# Patient Record
Sex: Female | Born: 1980 | Race: Black or African American | Hispanic: No | Marital: Single | Smoking: Never smoker
Health system: Southern US, Community
[De-identification: ages and names within clinical notes are randomized; demographics above are authoritative.]

## PROBLEM LIST (undated history)

## (undated) DIAGNOSIS — J209 Acute bronchitis, unspecified: Secondary | ICD-10-CM

## (undated) DIAGNOSIS — I10 Essential (primary) hypertension: Secondary | ICD-10-CM

## (undated) DIAGNOSIS — B019 Varicella without complication: Secondary | ICD-10-CM

## (undated) DIAGNOSIS — B029 Zoster without complications: Secondary | ICD-10-CM

## (undated) DIAGNOSIS — D649 Anemia, unspecified: Secondary | ICD-10-CM

## (undated) DIAGNOSIS — E0590 Thyrotoxicosis, unspecified without thyrotoxic crisis or storm: Secondary | ICD-10-CM

## (undated) HISTORY — DX: Zoster without complications: B02.9

## (undated) HISTORY — DX: Anemia, unspecified: D64.9

## (undated) HISTORY — DX: Thyrotoxicosis, unspecified without thyrotoxic crisis or storm: E05.90

## (undated) HISTORY — DX: Varicella without complication: B01.9

## (undated) HISTORY — DX: Essential (primary) hypertension: I10

## (undated) HISTORY — DX: Acute bronchitis, unspecified: J20.9

---

## 2007-09-02 HISTORY — PX: THYROID SURGERY: SHX805

## 2007-09-07 ENCOUNTER — Other Ambulatory Visit: Admission: RE | Admit: 2007-09-07 | Discharge: 2007-09-07 | Payer: Self-pay | Admitting: Obstetrics and Gynecology

## 2007-11-03 ENCOUNTER — Encounter: Admission: RE | Admit: 2007-11-03 | Discharge: 2007-11-03 | Payer: Self-pay | Admitting: Endocrinology

## 2007-12-03 ENCOUNTER — Encounter: Admission: RE | Admit: 2007-12-03 | Discharge: 2007-12-03 | Payer: Self-pay | Admitting: Endocrinology

## 2008-04-24 ENCOUNTER — Ambulatory Visit: Payer: Self-pay | Admitting: *Deleted

## 2008-04-24 DIAGNOSIS — I10 Essential (primary) hypertension: Secondary | ICD-10-CM | POA: Insufficient documentation

## 2008-04-24 DIAGNOSIS — R233 Spontaneous ecchymoses: Secondary | ICD-10-CM | POA: Insufficient documentation

## 2008-04-24 DIAGNOSIS — B029 Zoster without complications: Secondary | ICD-10-CM | POA: Insufficient documentation

## 2008-04-26 DIAGNOSIS — E038 Other specified hypothyroidism: Secondary | ICD-10-CM | POA: Insufficient documentation

## 2008-05-03 ENCOUNTER — Telehealth (INDEPENDENT_AMBULATORY_CARE_PROVIDER_SITE_OTHER): Payer: Self-pay | Admitting: *Deleted

## 2008-05-10 ENCOUNTER — Telehealth (INDEPENDENT_AMBULATORY_CARE_PROVIDER_SITE_OTHER): Payer: Self-pay | Admitting: *Deleted

## 2008-11-15 ENCOUNTER — Ambulatory Visit: Payer: Self-pay | Admitting: *Deleted

## 2008-11-15 DIAGNOSIS — R079 Chest pain, unspecified: Secondary | ICD-10-CM | POA: Insufficient documentation

## 2009-02-08 ENCOUNTER — Other Ambulatory Visit: Admission: RE | Admit: 2009-02-08 | Discharge: 2009-02-08 | Payer: Self-pay | Admitting: Obstetrics and Gynecology

## 2009-04-21 ENCOUNTER — Emergency Department (HOSPITAL_BASED_OUTPATIENT_CLINIC_OR_DEPARTMENT_OTHER): Admission: EM | Admit: 2009-04-21 | Discharge: 2009-04-21 | Payer: Self-pay | Admitting: Emergency Medicine

## 2009-04-21 ENCOUNTER — Ambulatory Visit: Payer: Self-pay | Admitting: Diagnostic Radiology

## 2009-05-01 ENCOUNTER — Ambulatory Visit: Payer: Self-pay | Admitting: Family Medicine

## 2009-05-02 LAB — CONVERTED CEMR LAB
ALT: 15 units/L (ref 0–35)
AST: 18 units/L (ref 0–37)
Albumin: 4.6 g/dL (ref 3.5–5.2)
Alkaline Phosphatase: 32 units/L — ABNORMAL LOW (ref 39–117)
Basophils Absolute: 0 10*3/uL (ref 0.0–0.1)
Calcium: 9.2 mg/dL (ref 8.4–10.5)
Chloride: 104 meq/L (ref 96–112)
Hemoglobin: 12.6 g/dL (ref 12.0–15.0)
Lymphocytes Relative: 37 % (ref 12–46)
MCHC: 34.6 g/dL (ref 30.0–36.0)
MCV: 89 fL (ref 78.0–100.0)
Monocytes Absolute: 0.4 10*3/uL (ref 0.1–1.0)
Neutro Abs: 2.7 10*3/uL (ref 1.7–7.7)
Neutrophils Relative %: 53 % (ref 43–77)
Sodium: 138 meq/L (ref 135–145)
Total Bilirubin: 0.7 mg/dL (ref 0.3–1.2)
Total Protein: 7.4 g/dL (ref 6.0–8.3)

## 2009-05-03 ENCOUNTER — Telehealth: Payer: Self-pay | Admitting: Family Medicine

## 2009-05-10 ENCOUNTER — Emergency Department (HOSPITAL_BASED_OUTPATIENT_CLINIC_OR_DEPARTMENT_OTHER): Admission: EM | Admit: 2009-05-10 | Discharge: 2009-05-10 | Payer: Self-pay | Admitting: Emergency Medicine

## 2009-06-05 ENCOUNTER — Encounter: Payer: Self-pay | Admitting: Family Medicine

## 2009-07-18 ENCOUNTER — Emergency Department (HOSPITAL_BASED_OUTPATIENT_CLINIC_OR_DEPARTMENT_OTHER): Admission: EM | Admit: 2009-07-18 | Discharge: 2009-07-19 | Payer: Self-pay | Admitting: Emergency Medicine

## 2009-07-19 ENCOUNTER — Ambulatory Visit: Payer: Self-pay | Admitting: Diagnostic Radiology

## 2009-10-29 ENCOUNTER — Ambulatory Visit (HOSPITAL_BASED_OUTPATIENT_CLINIC_OR_DEPARTMENT_OTHER): Admission: RE | Admit: 2009-10-29 | Discharge: 2009-10-29 | Payer: Self-pay | Admitting: Internal Medicine

## 2009-10-29 ENCOUNTER — Ambulatory Visit: Payer: Self-pay | Admitting: Family

## 2009-10-29 ENCOUNTER — Ambulatory Visit: Payer: Self-pay | Admitting: Radiology

## 2009-10-29 LAB — CONVERTED CEMR LAB: Beta hcg, urine, semiquantitative: NEGATIVE

## 2010-02-06 ENCOUNTER — Ambulatory Visit: Payer: Self-pay | Admitting: Family

## 2010-02-06 LAB — CONVERTED CEMR LAB
BUN: 10 mg/dL (ref 6–23)
Calcium: 8.9 mg/dL (ref 8.4–10.5)
Chloride: 103 meq/L (ref 96–112)
Cholesterol: 176 mg/dL (ref 0–200)
Creatinine, Ser: 0.97 mg/dL (ref 0.40–1.20)
HDL: 57 mg/dL (ref >39)
LDL Cholesterol: 103 mg/dL — ABNORMAL HIGH (ref 0–99)
MCV: 91 fL (ref 78.0–100.0)
Total CHOL/HDL Ratio: 3.1
Triglycerides: 81 mg/dL (ref <150)
VLDL: 16 mg/dL (ref 0–40)

## 2010-02-07 ENCOUNTER — Encounter: Payer: Self-pay | Admitting: Family

## 2010-03-26 ENCOUNTER — Encounter: Payer: Self-pay | Admitting: Internal Medicine

## 2010-03-26 ENCOUNTER — Telehealth: Payer: Self-pay | Admitting: Internal Medicine

## 2010-03-26 ENCOUNTER — Encounter: Payer: Self-pay | Admitting: Family

## 2010-04-02 ENCOUNTER — Ambulatory Visit: Payer: Self-pay | Admitting: Internal Medicine

## 2010-05-03 ENCOUNTER — Ambulatory Visit: Payer: Self-pay | Admitting: Internal Medicine

## 2010-08-13 ENCOUNTER — Ambulatory Visit: Payer: Self-pay | Admitting: Family

## 2010-08-13 ENCOUNTER — Encounter: Payer: Self-pay | Admitting: Internal Medicine

## 2010-08-13 ENCOUNTER — Telehealth: Payer: Self-pay | Admitting: Internal Medicine

## 2010-08-13 ENCOUNTER — Encounter: Payer: Self-pay | Admitting: Family

## 2010-08-13 DIAGNOSIS — R29818 Other symptoms and signs involving the nervous system: Secondary | ICD-10-CM | POA: Insufficient documentation

## 2010-08-16 ENCOUNTER — Encounter: Payer: Self-pay | Admitting: Family

## 2010-10-01 NOTE — Letter (Signed)
Summary: [facility] Endocrinology  [facility]   Imported By: Lanelle Bal 04/12/2010 13:52:39  _____________________________________________________________________  External Attachment:    Type:   Image     Comment:   External Document

## 2010-10-01 NOTE — Assessment & Plan Note (Signed)
Summary: Lower abd  pain/hea   Vital Signs:  Patient profile:   30 year old female Menstrual status:  regular Weight:      132.50 pounds BMI:     22.83 Temp:     98.1 degrees F oral Pulse rate:   76 / minute Pulse rhythm:   regular Resp:     16 per minute BP sitting:   122 / 98  (right arm) Cuff size:   regular  Vitals Entered By: Mervin Kung CMA (October 29, 2009 1:33 PM) CC: room 4 abd pain Comments RLQ pain, "dull ache that comes and goes" since last Tuesday; especially after running. Swollen glands in neck since thursday. Pt states she did not start the BP med prescribed last.   Primary Care Provider:  Paulo Fruit MD  CC:  room 4 abd pain.  History of Present Illness: Claire Haynes is a 30 year old female who presents with  c/o RLQ pain since last Tuesday.  Notes that on Thursday she tried to run on treadmill but had to stop due pain.  Now pain is intermittent and nagging.  She has not taken any OTC meds.  She did have some nausea on friday night, but this had resolved by the time that she woke up on saturday.   Denies fever, but notes + lethargy.  Notes + swollen glands and on the back of her neck.  Notes intermittent diarrhea.  LMP was last week and was normal.  Patient still has appendix.    Allergies (verified): No Known Drug Allergies  Physical Exam  General:  Well-developed,well-nourished,in no acute distress; alert,appropriate and cooperative throughout examination Head:  Normocephalic and atraumatic without obvious abnormalities. No apparent alopecia or balding. Eyes:  + exopthalmos Neck:  mild cervical LAD, mild occipital LAD Lungs:  Normal respiratory effort, chest expands symmetrically. Lungs are clear to auscultation, no crackles or wheezes. Heart:  Normal rate and regular rhythm. S1 and S2 normal without gallop, murmur, click, rub or other extra sounds. Abdomen:  soft, + BS,  + RLQ pain to palpation without guarding.  No sign of acute  abdomen.   Impression & Recommendations:  Problem # 1:  ABDOMINAL PAIN, RIGHT LOWER QUADRANT (ICD-789.03) Assessment New Reviewed CT- negative.  I suspect that this pain is due to muscle strain.  I call patient and discussed these results.  Recommended that she try motrin and limit running until it feels better.  F/u if no improvement in the next few weeks.   Orders: Misc. Referral (Misc. Ref) Urine Pregnancy Test  (56213)  Problem # 2:  CERVICAL LYMPHADENOPATHY (ICD-785.6) Assessment: New Mild cervical/occipital LAD- ? due to viral eitiology- monitor.    Complete Medication List: 1)  Synthroid 50 Mcg Tabs (Levothyroxine sodium) .... Take 1 tablet by mouth once a day 2)  Ramipril 2.5 Mg Caps (Ramipril) .Marland Kitchen.. 1 tab by mouth daily  Patient Instructions: 1)  Please complete your CT today. 2)  Call if fever over 101, worsening abdominal pain, nausea, vomitting or diarrhea.  Current Allergies (reviewed today): No known allergies    Laboratory Results   Urine Tests      Urine HCG: negative

## 2010-10-01 NOTE — Letter (Signed)
    at [facility] [address] [place], [place]  [address]  [place] Phone: [phone]      February 07, 2010   [facility] [address] [place], [place] [address]  RE:  LAB RESULTS  Dear  Ms. Peacock,  The following is an interpretation of your most recent lab tests.  Please take note of any instructions provided or changes to medications that have resulted from your lab work.  ELECTROLYTES:  Good - no changes needed  KIDNEY FUNCTION TESTS:  Good - no changes needed  LIVER FUNCTION TESTS:  Good - no changes needed  LIPID PANEL:  Good - no changes needed Triglyceride: 81   Cholesterol: 176   LDL: 103   HDL: 57   Chol/HDL%:  3.1 Ratio   CBC:  Good - no changes needed   Sincerely Yours,    Lemont Fillers FNP

## 2010-10-01 NOTE — Assessment & Plan Note (Signed)
Summary: cpx w/pap/dt--Rm 4   Vital Signs:  Patient profile:   30 year old female Menstrual status:  regular LMP:     01/15/2010 Height:      64 inches Weight:      130.75 pounds BMI:     22.52 Temp:     98.7 degrees F oral Pulse rate:   78 / minute Pulse rhythm:   regular Resp:     16 per minute BP sitting:   118 / 80  (right arm) Cuff size:   regular  Vitals Entered By: Mervin Kung CMA (February 06, 2010 11:30 AM) CC: Room 4  Pt here for physical and pap smear. Pt would like copy of office visit today. LMP (date): 01/15/2010 LMP - Character: normal LMP - Reliable? Yes Menarche (age onset years): 13   Menses interval (days): 28 Menstrual flow (days): 5 Enter LMP: 01/15/2010 Last PAP Result Normal   Primary Care Provider:  Paulo Fruit MD  CC:  Room 4  Pt here for physical and pap smear. Pt would like copy of office visit today.Marland Kitchen  History of Present Illness: Ms Eckersley is a 30 year old female who presents today for a complete physical.  Preventative- Runs 3 days a week about 1.5 miles each time.  Also weight lifts 3 days a week.  Diet is not always healthy- eats too many sweets.  She continues to see Dr. Peterson Ao from endocrinology for her hypothyroid.  Notes 5-6 lifetime sexual partners.   Preventive Screening-Counseling & Management  Hep-HIV-STD-Contraception     STD Risk: no risk noted  Allergies (verified): No Known Drug Allergies  Past History:  Past Medical History: Last updated: 11/15/2008 Hyperthyroidism - s/p ablation - now with hypothyroidism - sees endocrinology anemia  Past Surgical History: Last updated: 11/15/2008 thyroid ablation  Family History: Last updated: 02/06/2010 no significant medical history noted  Mom- living, HTN, Hyperlipidemia, Hyperthyroidsm Dad- living, pt unsure of history Sister- alive and well Brother- healthy  Social History: Last updated: 02/06/2010 Occupation:patient is a Engineer, civil (consulting) - RN at [employer]  Oncology single no children Never smoked Denies ETOH Denies drug use.  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 Never smoked Denies ETOH Denies drug use.STD Risk:  no risk noted  Review of Systems       Constitutional: Denies Fever ENT:  mild nasal congestion(cold) Resp: Denies cough CV:  Denies Chest Pain, sob GI:  Denies nausea or vomitting GU: Denies dysuria Lymphatic: Denies lymphadenopathy Musculoskeletal:  Denies muscle/joint pain Skin:  Denies Rashes or concerning lesions Psychiatric: Denies depression or anxiety Neuro: Denies numbness or weakness     Physical Exam  General:  Well-developed,well-nourished,in no acute distress; alert,appropriate and cooperative throughout examination Head:  Normocephalic and atraumatic without obvious abnormalities. No apparent alopecia or balding. Eyes:  PERRLA Ears:  External ear exam shows no significant lesions or deformities.  Otoscopic examination reveals clear canals, tympanic membranes are intact bilaterally without bulging, retraction, inflammation or discharge. Hearing is grossly normal bilaterally. Mouth:  Oral mucosa and oropharynx without lesions or exudates.  Teeth in good repair. Neck:  No deformities, masses, or noted. Lungs:  Normal respiratory effort,  chest expands symmetrically. Lungs are clear to auscultation, no crackles or wheezes. Heart:  Normal rate and regular rhythm. S1 and S2 normal without gallop, murmur, click, rub or other extra sounds. Abdomen:  Bowel sounds positive,abdomen soft and non-tender  without masses, organomegaly or hernias noted. Genitalia:  Pelvic Exam:        External: normal female genitalia without lesions or masses        Vagina: normal without lesions or masses        Cervix: normal without lesions or masses        Adnexa: normal bimanual exam without masses or fullness        Uterus: normal by palpation        Pap smear: not performed Extremities:  No clubbing, cyanosis, edema, or deformity noted  Neurologic:  No cranial nerve deficits noted.  Plantar reflexes are down-going bilaterally. DTRs are symmetrical throughout. Sensory, motor and coordinative functions appear intact. Skin:  Intact without suspicious lesions or rashes Psych:  Cognition and judgment appear intact. Alert and cooperative with normal attention span and concentration. No apparent delusions, illusions, hallucinations   Impression & Recommendations:  Problem # 1:  Preventive Health Care (ICD-V70.0) Assessment Comment Only  Immunizations reviewed and up to date.  Patient exercises regularly and was encouraged to continue.  She was counseled on healthy diet.  Pap smear attempted today, but not completed due to technically difficult exam (cervix very posterior)  Recommend that she follow up with GYN for Pap- pt will call for appointment.  Orders: T-Basic Metabolic Panel [phone]) T-Lipid Profile [id number]) T-CBC No Diff ([id number]) T-HIV-1 (Screen) (10272)  Complete Medication List: 1)  Synthroid 50 Mcg Tabs (Levothyroxine sodium) .... Take 1 tablet by mouth once a day  Patient Instructions: 1)  Good job with the exercise. 2)  Try to work hard on a healthy diet. 3)  If you do not eat 3 servings of dairy every day, you should add a calcium supplement Caltrate 600mg  + D twice daily. 4)  Follow up in 1 year.   Preventive Care Screening  Last Tetanus Booster:    Date:  01/30/2010    Results:  Historical     Immunization History:  Influenza Immunization History:     Influenza:  historical (07/20/2009)   Current Allergies (reviewed today): No known allergies

## 2010-10-01 NOTE — Assessment & Plan Note (Signed)
Summary: pt wants to see Dr Artist Pais,  Bp  & HA  Claire Haynes   Vital Signs:  Patient profile:   30 year old female Menstrual status:  regular Height:      64 inches Weight:      134.50 pounds BMI:     23.17 O2 Sat:      100 % on Room air Temp:     98.5 degrees F oral Pulse rate:   72 / minute Pulse rhythm:   regular Resp:     14 per minute BP sitting:   140 / 100  (right arm) Cuff size:   regular  Vitals Entered By: Glendell Docker CMA (April 02, 2010 8:27 AM)  O2 Flow:  Room air CC: Rm 3- Follow up on blood pressure and headaches Is Patient Diabetic? No Pain Assessment Patient in pain? no      Comments c/o headaches 2-3 times a week lasting all day, light but no sound sensitivity, checking blood pressure at work 160/94, 155/92, past year they have started to elevated no added supplements   Primary Care Provider:  Lemont Fillers FNP  CC:  Rm 3- Follow up on blood pressure and headaches.  History of Present Illness: 30 y/o AA female c/o intermittent headaches worse over last 3 - 4 months.  assoc with BP elevation Dr. Cathey Endow prescribed - ramipril but never started Prev PCP recommended  HCTZ but 155 , 160/94  she also could not tolerate increase in urinary freq  MVI - C, D, E no other OTC meds or supplements  left sided HA nagging pain not relieved by tylenol 2 cups of AM when she works no coffee on days off  exercises regularly no supplement use  Preventive Screening-Counseling & Management  Alcohol-Tobacco     Smoking Status: never  Allergies (verified): No Known Drug Allergies  Past History:  Past Medical History: Hyperthyroidism - s/p ablation - now with hypothyroidism - sees endocrinology anemia    Past Surgical History: thyroid ablation   Family History: no significant medical history noted  Mom- living, HTN, Hyperlipidemia, Hyperthyroidsm Dad- living, pt unsure of history Sister- alive and well Brother- healthy    Social  History: Occupation:patient is a Engineer, 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  Physical Exam  General:  alert, well-developed, and well-nourished.   Lungs:  Normal respiratory effort, chest expands symmetrically. Lungs are clear to auscultation, no crackles or wheezes. Heart:  Normal rate and regular rhythm. S1 and S2 normal without gallop, murmur, click, rub or other extra sounds. Abdomen:  soft, non-tender, and normal bowel sounds.     Impression & Recommendations:  Problem # 1:  HYPERTENSION (ICD-401.9) Elevated BP likely assoc with headaches. we discussed bystolic - category C for pregnancy  Her updated medication list for this problem includes:    Bystolic 5 Mg Tabs (Nebivolol hcl) ..... One by mouth once daily  BP today: 140/100 Prior BP: 118/80 (02/06/2010)  Labs Reviewed: K+: 3.8 (02/06/2010) Creat: : 0.97 (02/06/2010)   Chol: 176 (02/06/2010)   HDL: 57 (02/06/2010)   LDL: 103 (02/06/2010)   TG: 81 (02/06/2010)  Complete Medication List: 1)  Synthroid 50 Mcg Tabs (Levothyroxine sodium) .... Take 1 tablet by mouth once a day 2)  Bystolic 5 Mg Tabs (Nebivolol hcl) .... One by mouth once daily  Patient Instructions: 1)  Please schedule a follow-up appointment in 6 weeks. 2)  [email or web address]  3)  Limit your Sodium (Salt) to less than 2.5 grams a day(approx 1/2 a teaspoon)   Preventive Care Screening  Pap Smear:    Date:  03/25/2010    Results:  normal    Current Allergies (reviewed today): No known allergies

## 2010-10-01 NOTE — Progress Notes (Signed)
Summary: Request rx for Ramipril  Phone Note Call from Patient Call back at Home Phone [phone]   Caller: Patient Call For: osullivan  Summary of Call: she had a script from Dr Cathey Endow for ramipril and needs a refill.  [facility]  Initial call taken by: Roselle Locus,  March 26, 2010 2:55 PM  Follow-up for Phone Call        Left message on machine to return my call. Nicki Guadalajara Fergerson CMA Duncan Dull)  March 26, 2010 4:29 PM   Additional Follow-up for Phone Call Additional follow up Details #1::        Pt returned my call and stated that her home readings have been elevated recently: 145-155 systolic and in the 90s diastolic. At recent endocrinology appt BP was 155/93. Pt now wants to try Ramipril rx that Dr. Cathey Endow recommended some time back.  Please advise.  Nicki Guadalajara Fergerson CMA Duncan Dull)  March 26, 2010 4:48 PM     Additional Follow-up for Phone Call Additional follow up Details #2::    ACE inhibitor would not be my first choice in 30 y/o female of child bearing age her prev BP readings have been normal I suggest she keep log of  daily BP readings and f/u with Melissa within 2 weeks also, make sure pt not taking OTC decongestants or NSAIDs Follow-up by: [name] DO,  March 26, 2010 5:06 PM  Additional Follow-up for Phone Call Additional follow up Details #3:: Details for Additional Follow-up Action Taken: Advised pt per Dr. Olegario Messier instructions. Pt states she is not taking decongestants or NSAIDs.  Pt requested to schedule follow up with Dr. Artist Pais. Appt. scheduled for 04/17/10 @ 9:30am.  Mervin Kung CMA (AAMA)  March 27, 2010 9:32 AM

## 2010-10-01 NOTE — Assessment & Plan Note (Signed)
Summary: New pt, follow up bp readings / tf,cma   Vital Signs:  Patient profile:   30 year old female Menstrual status:  regular Height:      64 inches Weight:      135.50 pounds BMI:     23.34 O2 Sat:      100 % on Room air Temp:     98.5 degrees F oral Pulse rate:   65 / minute Pulse rhythm:   regular Resp:     16 per minute BP sitting:   116 / 80  (left arm) Cuff size:   regular  Vitals Entered By: Glendell Docker CMA (May 03, 2010 9:40 AM)  O2 Flow:  Room air CC: follow-up visit Is Patient Diabetic? No Pain Assessment Patient in pain? no      Comments folloow up on blood pressure, improved, no concerns   Primary Care Sylvanna Burggraf:  Lemont Fillers FNP  CC:  follow-up visit.  History of Present Illness:  Hypertension Follow-Up      This is a 30 year old woman who presents for Hypertension follow-up.  The patient denies lightheadedness and headaches.  The patient denies the following associated symptoms: chest pain.  Compliance with medications (by patient report) has been near 100%.  The patient reports that dietary compliance has been fair.  The patient reports exercising 3-4X per week.    Preventive Screening-Counseling & Management  Alcohol-Tobacco     Smoking Status: never  Allergies (verified): No Known Drug Allergies  Past History:  Past Medical History: Hyperthyroidism - s/p ablation - now with hypothyroidism - sees endocrinology anemia     Past Surgical History: thyroid ablation    Family History: no significant medical history noted  Mom- living, HTN, Hyperlipidemia, Hyperthyroidsm Dad- living, pt unsure of history Sister- alive and well Brother- healthy      Physical Exam  General:  alert, well-developed, and well-nourished.   Neck:  No deformities, masses, or noted. Lungs:  Normal respiratory effort, chest expands symmetrically. Lungs are clear to auscultation, no crackles or wheezes. Heart:  Normal rate and regular rhythm. S1  and S2 normal without gallop, murmur, click, rub or other extra sounds. Extremities:  No clubbing, cyanosis, edema, or deformity noted    Impression & Recommendations:  Problem # 1:  HYPERTENSION (ICD-401.9) Assessment Improved Headaches much better.  occ BP is low normal.  no dizziness.  she can monitor BP at home.  pt will try taking 1/2 bystolic she understands bystolic in preg category C. pt will notify us re:  family planning.  if and when she starts family - switch to labetalol Her updated medication list for this problem includes:    Bystolic 5 Mg Tabs (Nebivolol hcl) ..... 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 One by mouth qd  last TSH at [facility] - 4.497. Increase synthroid to .  goal TSH between 1 and 2  Complete Medication List: 1)  Synthroid 75 Mcg Tabs (Levothyroxine sodium) .... One by mouth qd 2)  Bystolic 5 Mg Tabs (Nebivolol hcl) .... One by mouth once daily  Patient Instructions: 1)  Please schedule a follow-up appointment in 6 months. 2)  TSH prior to visit, ICD-9:  244.9 3)  Please return for lab work within 3 months Prescriptions: BYSTOLIC 5 MG TABS (NEBIVOLOL HCL) one by mouth once daily  #  90 x 1   Entered and Authorized by:   [name] DO   Signed by:   [name] DO on 05/03/2010   Method used:   Electronically to        [facility]* (retail)       [facility] [place], [place]  [address]       Ph: [phone]       Fax: [phone]   RxID:   [id number] SYNTHROID 75 MCG TABS (LEVOTHYROXINE SODIUM) one by mouth qd  #90 x 1   Entered and Authorized by:   [name] DO   Signed by:   [name] DO on 05/03/2010   Method used:   Electronically to        [facility]* (retail)       [facility] [place], [place]  [address]       Ph: [phone]       Fax: [phone]   RxID:   [id number]   Current Allergies (reviewed today): No known allergies

## 2010-10-03 NOTE — Progress Notes (Signed)
Summary: Chest Pain  Phone Note Call from Patient Call back at Home Phone [phone]   Caller: Patient Call For: [name] DO Summary of Call: patient called states that she has been having chest pain, but she is not having shortness of breath. The pain occurs on right side and sometimes on her left, intermittently for the past 2 months ,lasting 3-4 minutes  occurring 3-4 times throughout the day.  She states it is more a pulling sensation than anything else. She states that she has been seen in the past for similar problems and was diagnosed with a muscoskeletal pain. She was informed that she would need evaluation. She was advised, if did not want to schedule for today, that she would need to seek care at an urgent care facility or ER. Patient state she will schedule. Appointment has been provided with Melissa @ 1:30 pm Initial call taken by: Glendell Docker CMA,  August 13, 2010 10:20 AM

## 2010-10-03 NOTE — Assessment & Plan Note (Signed)
Summary: CHEST PAIN/DK--Rm 4   Vital Signs:  Patient profile:   30 year old female Menstrual status:  regular Height:      64 inches Weight:      137 pounds BMI:     23.60 Temp:     98.2 degrees F oral Pulse rate:   60 / minute Pulse rhythm:   regular Resp:     12 per minute BP sitting:   120 / 90  (right arm) Cuff size:   regular  Vitals Entered By: Mervin Kung CMA Duncan Dull) (August 13, 2010 1:31 PM) CC: Pt states she has had intermittent pressure and pulling in her chest 3-4 x daily for 2 months. Pt would like cardiac workup and referral to cardiologist. Thyroid dose was increased a couple of months ago. Is Patient Diabetic? No Pain Assessment Patient in pain? no      Comments Pt agrees all med doses and directions are correct. Nicki Guadalajara Fergerson CMA Duncan Dull)  August 13, 2010 1:38 PM    Primary Care Provider:  Lemont Fillers FNP  CC:  Pt states she has had intermittent pressure and pulling in her chest 3-4 x daily for 2 months. Pt would like cardiac workup and referral to cardiologist. Thyroid dose was increased a couple of months ago.Marland Kitchen  History of Present Illness: Ms. Ackerley is a 30 year old female who presents today with chief complaint of chest pain.  Chest pain has been present for several months.  Intermittent in nature. Pain lasts 3 minutes, then will stop. Increasing in frequency. Sometimes on right side of chest and sometimes on left.  Pain is like a pulling, nagging pain.  Symptoms worsen with deep breath. Patient continues to do weight training 3x a week.  Now doing a more rigorous work out plan.  Denies any shortness of breath, wheezing or palpitations.   Patient also notes some associated right shoulder pain which is worsened by certain exercises.  She has tried ibuprofen with some relief.    Allergies (verified): No Known Drug Allergies  Past History:  Past Medical History: Last updated: 05/03/2010 Hyperthyroidism - s/p ablation - now with  hypothyroidism - sees endocrinology anemia     Past Surgical History: Last updated: 05/03/2010 thyroid ablation    Review of Systems       see HPI  Physical Exam  General:  Well-developed,well-nourished,in no acute distress; alert,appropriate and cooperative throughout examination Eyes:  +exopthalmos, sclera are clear Chest Wall:  no tenderness.   Lungs:  Normal respiratory effort, chest expands symmetrically. Lungs are clear to auscultation, no crackles or wheezes. Heart:  Normal rate and regular rhythm. S1 and S2 normal without gallop, murmur, click, rub or other extra sounds. Msk:  Full ROM of right shoulder, no crepitus.  No swelling.     Impression & Recommendations:  Problem # 1:  MUSCULOSKELETAL PAIN (ICD-781.99) Assessment New 30 year old female with atypical chest pain most likely due to musculoskeletal pain.  EKG performed today shows NSR.  Pt is very active and does regular weight training.  Recommended ibuprofen as needed and provided reassurance.  She does not wish to persue any further work up of her right shoulder pain at this time, but will try to tailor her work out to avoid further shoulder strain.    Problem # 2:  HYPOTHYROIDISM, POSTABLATION (ICD-244.8) Assessment: Comment Only Synthroid was increased from 50 micrograms to 75 micrograms in september, will check f/u TSH.   Her updated medication list for this  problem includes:    Synthroid 75 Mcg Tabs (Levothyroxine sodium) ..... One by mouth qd  Complete Medication List: 1)  Synthroid 75 Mcg Tabs (Levothyroxine sodium) .... One by mouth qd 2)  Bystolic 5 Mg Tabs (Nebivolol hcl) .... One by mouth once daily 3)  Daily Multi Tabs (Multiple vitamins-minerals) .... Take 1 tablet by mouth once a day.  Other Orders: EKG w/ Interpretation (93000) TLB-TSH (Thyroid Stimulating Hormone) (84443-TSH)  Patient Instructions: 1)  Take 400-600mg  of Ibuprofen (Advil, Motrin) with food every 4-6 hours as needed for relief  of pain. 2)  Avoid exercises that worsen your pain.  3)  Call if symptoms worsen, or do not improve. 4)  Please complete your lab work on the 1st floor today. 5)  Follow up with Dr. Artist Pais in 3 months- sooner if problems or concerns.    Orders Added: 1)  EKG w/ Interpretation [93000] 2)  TLB-TSH (Thyroid Stimulating Hormone) [84443-TSH] 3)  Est. Patient Level III [[address]]    Current Allergies (reviewed today): No known allergies

## 2010-10-03 NOTE — Letter (Signed)
   [place] at [facility] [address] [place], [place]  [address]  [place] Phone: [phone]      August 16, 2010   [facility] [place] [address] [place], [place] [address]  RE:  LAB RESULTS  Dear  Ms. Hartzell,  The following is an interpretation of your most recent lab tests.  Please take note of any instructions provided or changes to medications that have resulted from your lab work.   THYROID STUDIES:  Thyroid studies normal TSH: 2.742    Please follow up with Dr. Artist Pais in 3 months.   Sincerely Yours,    Lemont Fillers FNP  Appended Document:  mailed

## 2010-10-09 ENCOUNTER — Telehealth: Payer: Self-pay | Admitting: Internal Medicine

## 2010-10-17 NOTE — Progress Notes (Signed)
Summary: Bystolic Samples  Phone Note Call from Patient Call back at [facility] Phone [phone]   Caller: Patient Call For: [name] DO Summary of Call: patient called and left voice message requesting samples of Bystolic Initial call taken by: Glendell Docker CMA,  October 09, 2010 11:55 AM  Follow-up for Phone Call        call was returned to patient at [phone], no answer. A voice message was left for patient informing her samples of Bystolic would be left at front desk for patient pick up. Follow-up by: Glendell Docker CMA,  October 09, 2010 11:56 AM    Prescriptions: BYSTOLIC 5 MG TABS (NEBIVOLOL HCL) one by mouth once daily  #30 x 0   Entered by:   Glendell Docker CMA   Authorized by:   [name] DO   Signed by:   Glendell Docker CMA on 10/09/2010   Method used:   Samples Given   RxID:   [id number]

## 2010-10-28 ENCOUNTER — Ambulatory Visit: Payer: Self-pay | Admitting: Family

## 2010-10-28 ENCOUNTER — Encounter: Payer: Self-pay | Admitting: Internal Medicine

## 2010-10-28 ENCOUNTER — Ambulatory Visit (INDEPENDENT_AMBULATORY_CARE_PROVIDER_SITE_OTHER): Payer: PRIVATE HEALTH INSURANCE | Admitting: Internal Medicine

## 2010-10-28 DIAGNOSIS — J209 Acute bronchitis, unspecified: Secondary | ICD-10-CM

## 2010-11-04 ENCOUNTER — Ambulatory Visit: Payer: Self-pay | Admitting: Internal Medicine

## 2010-11-04 DIAGNOSIS — Z0289 Encounter for other administrative examinations: Secondary | ICD-10-CM

## 2010-11-13 ENCOUNTER — Ambulatory Visit: Payer: PRIVATE HEALTH INSURANCE | Admitting: Internal Medicine

## 2010-11-13 DIAGNOSIS — Z0289 Encounter for other administrative examinations: Secondary | ICD-10-CM

## 2010-11-14 ENCOUNTER — Ambulatory Visit: Payer: Self-pay | Admitting: Internal Medicine

## 2010-11-18 ENCOUNTER — Encounter: Payer: Self-pay | Admitting: Internal Medicine

## 2010-11-18 ENCOUNTER — Ambulatory Visit (INDEPENDENT_AMBULATORY_CARE_PROVIDER_SITE_OTHER): Payer: PRIVATE HEALTH INSURANCE | Admitting: Internal Medicine

## 2010-11-18 DIAGNOSIS — I10 Essential (primary) hypertension: Secondary | ICD-10-CM

## 2010-11-18 DIAGNOSIS — R079 Chest pain, unspecified: Secondary | ICD-10-CM

## 2010-11-18 DIAGNOSIS — E038 Other specified hypothyroidism: Secondary | ICD-10-CM

## 2010-11-18 LAB — CONVERTED CEMR LAB: Free T4: 1.01 ng/dL (ref 0.80–1.80)

## 2010-11-19 ENCOUNTER — Telehealth: Payer: Self-pay | Admitting: Internal Medicine

## 2010-11-19 DIAGNOSIS — E890 Postprocedural hypothyroidism: Secondary | ICD-10-CM

## 2010-11-19 MED ORDER — LEVOTHYROXINE SODIUM 100 MCG PO TABS: 100.0000 ug | ORAL_TABLET | Freq: Every day | ORAL | Status: DC

## 2010-11-19 NOTE — Assessment & Plan Note (Signed)
Summary: sick/ss   Vital Signs:  Patient profile:   30 year old female Menstrual status:  regular Height:      64 inches Weight:      136.50 pounds BMI:     23.51 O2 Sat:      99 % on Room air Temp:     98.1 degrees F oral Pulse rate:   63 / minute Resp:     18 per minute BP sitting:   110 / 80  (right arm)  Vitals Entered By: Glendell Docker CMA (October 28, 2010 10:33 AM)  O2 Flow:  Room air CC: Sinus Congestion Is Patient Diabetic? No Pain Assessment Patient in pain? no        Primary Care Provider:  Lemont Fillers FNP  CC:  Sinus Congestion.  History of Present Illness: 30 y/o female c/o sinus congestion, Friday- cough -productive yellow in color, no relief with otc meds (mucinex) no fever  no SOB no facial pain or pressure   Preventive Screening-Counseling & Management  Alcohol-Tobacco     Smoking Status: never  Allergies (verified): No Known Drug Allergies  Past History:  Past Medical History: Hyperthyroidism - s/p ablation - now with hypothyroidism - sees endocrinology anemia      Past Surgical History: thyroid ablation     Social History: Occupation:patient is a Engineer, civil (consulting) - Charity fundraiser at [employer] single no children Never smoked Denies ETOH    Denies drug use.  Physical Exam  General:  alert, well-developed, and well-nourished.   Ears:  R ear normal and L ear normal.   Mouth:  pharyngeal erythema.   Neck:  supple, no masses, and no neck tenderness.   Lungs:  normal respiratory effort.  coarse breath sounds L >R Heart:  normal rate, regular rhythm, and no gallop.     Impression & Recommendations:  Problem # 1:  BRONCHITIS-ACUTE (ICD-466.0)  Her updated medication list for this problem includes:    Cefuroxime Axetil 500 Mg Tabs (Cefuroxime axetil) ..... One by mouth two times a day    Azithromycin 250 Mg Tabs (Azithromycin) .Marland Kitchen... 2 tabs on day one, then one by mouth once daily x 4 days  Take antibiotics and other  medications as directed. Patient advised to call office if symptoms persist or worsen.  Complete Medication List: 1)  Levothyroxine Sodium 75 Mcg Tabs (Levothyroxine sodium) .... One by mouth once daily 2)  Bystolic 5 Mg Tabs (Nebivolol hcl) .... One tab by mouth once daily 3)  Daily Multi Tabs (Multiple vitamins-minerals) .... Take 1 tablet by mouth once a day. 4)  Cefuroxime Axetil 500 Mg Tabs (Cefuroxime axetil) .... One by mouth two times a day 5)  Azithromycin 250 Mg Tabs (Azithromycin) .... 2 tabs on day one, then one by mouth once daily x 4 days 6)  Prednisone 20 Mg Tabs (Prednisone) .... One by mouth once daily x 5 days, then 1/2 tab x 4 days  Patient Instructions: 1)  Call our office if your symptoms do not  improve or gets worse. Prescriptions: LEVOTHYROXINE SODIUM 75 MCG TABS (LEVOTHYROXINE SODIUM) one by mouth once daily  #90 x 1   Entered and Authorized by:   [name] DO   Signed by:   [name] DO on 10/28/2010   Method used:   Electronically to        [facility]* (retail)       [facility]  [place], [place]  [address]       Ph: [phone]       Fax: [phone]   RxID:   [id number] BYSTOLIC 5 MG TABS (NEBIVOLOL HCL) one tab by mouth once daily  #90 x 1   Entered and Authorized by:   [name] DO   Signed by:   [name] DO on 10/28/2010   Method used:   Electronically to        [facility]* (retail)       [facility] [address] [place], [place]  [address]       Ph: [phone]       Fax: [phone]   RxID:   [id number] PREDNISONE 20 MG TABS (PREDNISONE) one by mouth once daily x 5 days, then 1/2 tab x 4 days  #7 x 0   Entered and Authorized by:   [name] DO   Signed by:   [name] DO on 10/28/2010   Method used:   Electronically to        [facility] [address] (retail)       [phone]       [place], [place]  [address]        Ph: [phone]       Fax: [phone]   RxID:   [id number] AZITHROMYCIN 250 MG TABS (AZITHROMYCIN) 2 tabs on day one, then one by mouth once daily x 4 days  #6 x 0   Entered and Authorized by:   [name] DO   Signed by:   [name] DO on 10/28/2010   Method used:   Electronically to        [facility] [address] [place] (retail)       [phone]       [place], [place]  [address]       Ph: [phone]       Fax: [phone]   RxID:   [id number] CEFUROXIME AXETIL 500 MG TABS (CEFUROXIME AXETIL) one by mouth two times a day  #20 x 0   Entered and Authorized by:   [name] DO   Signed by:   [name] DO on 10/28/2010   Method used:   Electronically to        [facility] [address] [place] (retail)       [phone]       [place], [place]  [address]       Ph: [phone]       Fax: [phone]   RxID:   [id number]    Orders Added: 1)  Est. Patient Level III [[address]]    Current Allergies (reviewed today): No known allergies

## 2010-11-19 NOTE — Telephone Encounter (Signed)
Call placed to patient she was informed per Dr Yoo instructions 

## 2010-11-19 NOTE — Telephone Encounter (Signed)
Call pt - blood test shows pt needs higher dose of thyroid medication.  New rx sent to pharm.   Pt needs to return in 2 months for repeat thyroid blood test  TSH

## 2010-12-03 NOTE — Assessment & Plan Note (Signed)
Summary: follow up  on thyroid/mhf   Vital Signs:  Patient profile:   30 year old female Menstrual status:  regular Height:      64 inches Weight:      135.50 pounds BMI:     23.34 O2 Sat:      100 % on Room air Temp:     97.9 degrees F oral Pulse rate:   56 / minute Resp:     16 per minute BP sitting:   110 / 70  (left arm) Cuff size:   regular  Vitals Entered By: Glendell Docker CMA (November 18, 2010 8:35 AM)  O2 Flow:  Room air CC: follow-up visit on Thyroid Is Patient Diabetic? No Pain Assessment Patient in pain? no      Comments no concerns   Primary Care Provider:  Lemont Fillers FNP  CC:  follow-up visit on Thyroid.  History of Present Illness: 30 y/o female for follow up int hx: pt seen at [facility] for atypical chest pain work up reported negative  pt exercises regularly at gym symptoms somewhat improved with not going to gym x 1-2 weeks but not completely resolved location varies - right upper chest,  left breast symptoms last 1-2 secs no assoc SOB never exertional  no LE swelling or redness increased belching but no heartburn  Preventive Screening-Counseling & Management  Alcohol-Tobacco     Smoking Status: never  Allergies (verified): No Known Drug Allergies  Past History:  Past Medical History: Hyperthyroidism - s/p ablation - now with hypothyroidism - sees endocrinology anemia       Past Surgical History: thyroid ablation      Family History: no significant medical history noted  Mom- living, HTN, Hyperlipidemia, Hyperthyroidsm Dad- living, pt unsure of history Sister- alive and well Brother- healthy       Physical Exam  General:  alert, well-developed, and well-nourished.   Head:  normocephalic and atraumatic.   Lungs:  normal respiratory effort, normal breath sounds, no crackles, and no wheezes.   Heart:  normal rate, regular rhythm, and no gallop.   Extremities:  No lower extremity edema Neurologic:  cranial  nerves II-XII intact and gait normal.     Impression & Recommendations:  Problem # 1:  CHEST PAIN (ICD-786.50) symptoms atypical  I suspect musculoskeltal vs related to reflux pt to try taking OTC PPI x 4-6 weeks Patient advised to call office if symptoms persist or worsen.  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 One by mouth once daily  Orders: T-TSH [phone]) T-T4, Free [phone])  Labs Reviewed: TSH: 2.742 (08/13/2010)    Chol: 176 (02/06/2010)   HDL: 57 (02/06/2010)   LDL: 103 (02/06/2010)   TG: 81 (02/06/2010)  Problem # 3:  HYPERTENSION (ICD-401.9) Assessment: Unchanged  Her updated medication list for this problem includes:    Bystolic 5 Mg Tabs (Nebivolol hcl) ..... One tab by mouth once daily  BP today: 110/70 Prior BP: 110/80 (10/28/2010)  Labs Reviewed: K+: 3.8 (02/06/2010) Creat: : 0.97 (02/06/2010)   Chol: 176 (02/06/2010)   HDL: 57 (02/06/2010)   LDL: 103 (02/06/2010)   TG: 81 (02/06/2010)  Complete Medication List: 1)  Levothyroxine Sodium 75 Mcg Tabs (Levothyroxine sodium) .... One by mouth once daily 2)  Bystolic 5 Mg Tabs (Nebivolol hcl) .... One tab by mouth once daily 3)  Daily Multi Tabs (Multiple  vitamins-minerals) .... Take 1 tablet by mouth once a day.  Patient Instructions: 1)  Please schedule a follow-up appointment in 3 months.   Orders Added: 1)  T-TSH [[address]] 2)  T-T4, Free [[address]-91478] 3)  Est. Patient Level III [[address]]    Current Allergies (reviewed today): No known allergies

## 2010-12-04 LAB — COMPREHENSIVE METABOLIC PANEL
AST: 19 U/L (ref 0–37)
Albumin: 4.9 g/dL (ref 3.5–5.2)
CO2: 26 mEq/L (ref 19–32)
Chloride: 103 mEq/L (ref 96–112)
Creatinine, Ser: 1 mg/dL (ref 0.4–1.2)
Sodium: 142 mEq/L (ref 135–145)

## 2010-12-04 LAB — DIFFERENTIAL
Basophils Absolute: 0 10*3/uL (ref 0.0–0.1)
Eosinophils Absolute: 0.1 10*3/uL (ref 0.0–0.7)
Lymphs Abs: 1.8 10*3/uL (ref 0.7–4.0)
Monocytes Absolute: 0.4 10*3/uL (ref 0.1–1.0)
Monocytes Relative: 7 % (ref 3–12)
Neutro Abs: 3.4 10*3/uL (ref 1.7–7.7)
Neutrophils Relative %: 58 % (ref 43–77)

## 2010-12-04 LAB — URINALYSIS, ROUTINE W REFLEX MICROSCOPIC
Bilirubin Urine: NEGATIVE
Ketones, ur: 15 mg/dL — AB
Nitrite: NEGATIVE
Protein, ur: NEGATIVE mg/dL
Urobilinogen, UA: 0.2 mg/dL (ref 0.0–1.0)

## 2010-12-04 LAB — LIPASE, BLOOD: Lipase: 38 U/L (ref 23–300)

## 2010-12-04 LAB — CBC
MCHC: 34.6 g/dL (ref 30.0–36.0)
RDW: 12.1 % (ref 11.5–15.5)
WBC: 5.7 10*3/uL (ref 4.0–10.5)

## 2010-12-07 LAB — URINALYSIS, ROUTINE W REFLEX MICROSCOPIC
Hgb urine dipstick: NEGATIVE
Nitrite: NEGATIVE
Urobilinogen, UA: 1 mg/dL (ref 0.0–1.0)

## 2010-12-07 LAB — PREGNANCY, URINE: Preg Test, Ur: NEGATIVE

## 2010-12-15 ENCOUNTER — Emergency Department (HOSPITAL_BASED_OUTPATIENT_CLINIC_OR_DEPARTMENT_OTHER)
Admission: EM | Admit: 2010-12-15 | Discharge: 2010-12-15 | Disposition: A | Discharge status: Home / Self Care | Payer: PRIVATE HEALTH INSURANCE | Attending: Emergency Medicine | Admitting: Emergency Medicine

## 2010-12-15 DIAGNOSIS — M79609 Pain in unspecified limb: Secondary | ICD-10-CM | POA: Insufficient documentation

## 2010-12-15 DIAGNOSIS — I10 Essential (primary) hypertension: Secondary | ICD-10-CM | POA: Insufficient documentation

## 2010-12-15 LAB — BASIC METABOLIC PANEL
BUN: 13 mg/dL (ref 6–23)
Chloride: 110 mEq/L (ref 96–112)
GFR calc Af Amer: >60 mL/min (ref >60)
GFR calc non Af Amer: >60 mL/min (ref >60)
Glucose, Bld: 100 mg/dL — ABNORMAL HIGH (ref 70–99)
Potassium: 3.9 mEq/L (ref 3.5–5.1)

## 2010-12-19 ENCOUNTER — Telehealth: Payer: Self-pay | Admitting: *Deleted

## 2010-12-19 NOTE — Telephone Encounter (Signed)
See order for CT of chest.

## 2010-12-19 NOTE — Telephone Encounter (Signed)
Patient called and left voice message stating her chest pain on her right side is unresolved and she would like to know if she could proceed with the CT scan as discussed with Dr Artist Pais at her last office visit.

## 2010-12-19 NOTE — Telephone Encounter (Signed)
Pt notified and requests that appt be scheduled for a morning time as she has to be at work by 3:30pm. Myriam Jacobson is aware.

## 2010-12-20 ENCOUNTER — Ambulatory Visit (HOSPITAL_BASED_OUTPATIENT_CLINIC_OR_DEPARTMENT_OTHER)
Admission: RE | Admit: 2010-12-20 | Discharge: 2010-12-20 | Disposition: A | Discharge status: Home / Self Care | Payer: PRIVATE HEALTH INSURANCE | Source: Ambulatory Visit | Attending: Internal Medicine | Admitting: Internal Medicine

## 2010-12-20 DIAGNOSIS — R0789 Other chest pain: Secondary | ICD-10-CM | POA: Insufficient documentation

## 2010-12-20 DIAGNOSIS — R079 Chest pain, unspecified: Secondary | ICD-10-CM

## 2010-12-20 DIAGNOSIS — K7689 Other specified diseases of liver: Secondary | ICD-10-CM | POA: Insufficient documentation

## 2010-12-21 ENCOUNTER — Encounter: Payer: Self-pay | Admitting: Internal Medicine

## 2010-12-23 ENCOUNTER — Ambulatory Visit: Payer: PRIVATE HEALTH INSURANCE | Admitting: Internal Medicine

## 2011-01-01 ENCOUNTER — Telehealth: Payer: Self-pay | Admitting: Internal Medicine

## 2011-01-01 NOTE — Telephone Encounter (Signed)
Call placed to patient at [phone], no answer. A detailed voice message was left informing patient per Dr Artist Pais instructions. Copy of report mailed to address on file for patient

## 2011-01-01 NOTE — Telephone Encounter (Signed)
Patient would like results of ct done 2 weeks ago

## 2011-01-01 NOTE — Telephone Encounter (Signed)
CT of Check - normal.   Ok to mail copy of report to pt

## 2011-01-15 ENCOUNTER — Encounter: Payer: Self-pay | Admitting: Internal Medicine

## 2011-01-15 ENCOUNTER — Telehealth: Payer: Self-pay | Admitting: *Deleted

## 2011-01-15 MED ORDER — FLUCONAZOLE 150 MG PO TABS: ORAL_TABLET | ORAL | Status: DC

## 2011-01-15 NOTE — Telephone Encounter (Signed)
Patient called and left a voice message stating she was recently treated for bronchitis infection. She states she now has what she knows to be a yeast infection from the antibiotics and she would like to know if a rx for a yeast infection could be sent to the pharmacy, or will she need a office visit for evaluation

## 2011-01-15 NOTE — Telephone Encounter (Signed)
Call placed to patient at [phone]  No answer. A detailed voice message was left informing patient Rx sent to pharmacy

## 2011-01-15 NOTE — Telephone Encounter (Signed)
Diflucan was sent to patient's pharmacy.

## 2011-02-03 ENCOUNTER — Telehealth: Payer: Self-pay | Admitting: Internal Medicine

## 2011-02-03 NOTE — Telephone Encounter (Signed)
Call placed to patient at [phone], she was informed samples of Bystolic would be left at front desk for patient pick up.

## 2011-02-03 NOTE — Telephone Encounter (Signed)
Samples of 5 mg bystolic

## 2011-02-13 ENCOUNTER — Ambulatory Visit: Payer: PRIVATE HEALTH INSURANCE | Admitting: Internal Medicine

## 2011-02-14 ENCOUNTER — Ambulatory Visit: Payer: PRIVATE HEALTH INSURANCE | Admitting: Family Medicine

## 2011-02-14 DIAGNOSIS — Z0289 Encounter for other administrative examinations: Secondary | ICD-10-CM

## 2011-08-13 ENCOUNTER — Telehealth: Payer: Self-pay | Admitting: Internal Medicine

## 2011-08-13 DIAGNOSIS — E890 Postprocedural hypothyroidism: Secondary | ICD-10-CM

## 2011-08-13 MED ORDER — LEVOTHYROXINE SODIUM 100 MCG PO TABS: 100.0000 ug | ORAL_TABLET | Freq: Every day | ORAL | Status: DC

## 2011-08-13 NOTE — Telephone Encounter (Signed)
rx sent in electronically 

## 2011-08-13 NOTE — Telephone Encounter (Signed)
Refill- levothyroxine t. Take one tablet by mouth once daily. Qty 90 last fill 7.19.12

## 2012-03-22 DIAGNOSIS — I517 Cardiomegaly: Secondary | ICD-10-CM | POA: Insufficient documentation

## 2012-03-22 HISTORY — DX: Cardiomegaly: I51.7

## 2012-11-11 ENCOUNTER — Ambulatory Visit: Payer: PRIVATE HEALTH INSURANCE | Admitting: Family Medicine

## 2012-11-30 ENCOUNTER — Ambulatory Visit (INDEPENDENT_AMBULATORY_CARE_PROVIDER_SITE_OTHER): Payer: 59 | Admitting: Family Medicine

## 2012-11-30 ENCOUNTER — Encounter: Payer: Self-pay | Admitting: Family Medicine

## 2012-11-30 VITALS — BP 138/110 | HR 82 | Temp 98.3°F | Ht 64.0 in | Wt 140.1 lb

## 2012-11-30 DIAGNOSIS — I10 Essential (primary) hypertension: Secondary | ICD-10-CM

## 2012-11-30 DIAGNOSIS — E038 Other specified hypothyroidism: Secondary | ICD-10-CM

## 2012-11-30 DIAGNOSIS — J019 Acute sinusitis, unspecified: Secondary | ICD-10-CM

## 2012-11-30 DIAGNOSIS — J209 Acute bronchitis, unspecified: Secondary | ICD-10-CM

## 2012-11-30 MED ORDER — SULFAMETHOXAZOLE-TRIMETHOPRIM 800-160 MG PO TABS: 1.0000 | ORAL_TABLET | Freq: Two times a day (BID) | ORAL | Status: DC

## 2012-11-30 MED ORDER — CARVEDILOL 3.125 MG PO TABS: 3.1250 mg | ORAL_TABLET | Freq: Two times a day (BID) | ORAL | Status: DC

## 2012-11-30 MED ORDER — NEBIVOLOL HCL 5 MG PO TABS: 5.0000 mg | ORAL_TABLET | Freq: Every day | ORAL | Status: DC

## 2012-11-30 NOTE — Patient Instructions (Addendum)
Rel of [facility]  Start a probiotic such as Digestive Advantage daily Add a zinc product such as Coldeeze or Xicam Mucinex 600 mg bid Increase hydration and rest  Hypertension As your heart beats, it forces blood through your arteries. This force is your blood pressure. If the pressure is too high, it is called hypertension (HTN) or high blood pressure. HTN is dangerous because you may have it and not know it. High blood pressure may mean that your heart has to work harder to pump blood. Your arteries may be narrow or stiff. The extra work puts you at risk for heart disease, stroke, and other problems.  Blood pressure consists of two numbers, a higher number over a lower, 110/72, for example. It is stated as "110 over 72." The ideal is below 120 for the top number (systolic) and under 80 for the bottom (diastolic). Write down your blood pressure today. You should pay close attention to your blood pressure if you have certain conditions such as:  Heart failure.  Prior heart attack.  Diabetes  Chronic kidney disease.  Prior stroke.  Multiple risk factors for heart disease. To see if you have HTN, your blood pressure should be measured while you are seated with your arm held at the level of the heart. It should be measured at least twice. A one-time elevated blood pressure reading (especially in the Emergency Department) does not mean that you need treatment. There may be conditions in which the blood pressure is different between your right and left arms. It is important to see your caregiver soon for a recheck. Most people have essential hypertension which means that there is not a specific cause. This type of high blood pressure may be lowered by changing lifestyle factors such as:  Stress.  Smoking.  Lack of exercise.  Excessive weight.  Drug/tobacco/alcohol use.  Eating less salt. Most people do not have symptoms from high blood pressure until it has caused damage  to the body. Effective treatment can often prevent, delay or reduce that damage. TREATMENT  When a cause has been identified, treatment for high blood pressure is directed at the cause. There are a large number of medications to treat HTN. These fall into several categories, and your caregiver will help you select the medicines that are best for you. Medications may have side effects. You should review side effects with your caregiver. If your blood pressure stays high after you have made lifestyle changes or started on medicines,   Your medication(s) may need to be changed.  Other problems may need to be addressed.  Be certain you understand your prescriptions, and know how and when to take your medicine.  Be sure to follow up with your caregiver within the time frame advised (usually within two weeks) to have your blood pressure rechecked and to review your medications.  If you are taking more than one medicine to lower your blood pressure, make sure you know how and at what times they should be taken. Taking two medicines at the same time can result in blood pressure that is too low. SEEK IMMEDIATE MEDICAL CARE IF:  You develop a severe headache, blurred or changing vision, or confusion.  You have unusual weakness or numbness, or a faint feeling.  You have severe chest or abdominal pain, vomiting, or breathing problems. MAKE SURE YOU:   Understand these instructions.  Will watch your condition.  Will get help right away if you are not doing well or  get worse. Document Released: 08/18/2005 Document Revised: 11/10/2011 Document Reviewed: 04/07/2008 [facility] [place] [facility] Patient Information 2013 [place], [place].

## 2012-12-04 ENCOUNTER — Encounter: Payer: Self-pay | Admitting: Family Medicine

## 2012-12-04 DIAGNOSIS — J209 Acute bronchitis, unspecified: Secondary | ICD-10-CM

## 2012-12-04 HISTORY — DX: Acute bronchitis, unspecified: J20.9

## 2012-12-04 NOTE — Assessment & Plan Note (Signed)
Has not been taking her meds for past month to 2. Has tolerated Bystolic in past but when patient got to pharmacy coset was prohibitive, will try Coreg bid for now and she will reprot worsening numbers. She stopped the Lisinorpil whe was given because she felt it was making her light headed and has felt better when she stopped it. Encouraged DASH diet

## 2012-12-04 NOTE — Assessment & Plan Note (Signed)
Free T4 normal, tsh mildly elevated will continue to monitor

## 2012-12-04 NOTE — Progress Notes (Signed)
Patient ID: Claire Haynes, female   DOB: [birth year], 32 y.o.   MRN: [id number] Claire Haynes [id number] 1980-10-05 12/04/2012      Progress Note New Patient  Subjective  Chief Complaint  Chief Complaint  Patient presents with  . Establish Care    re-establish- Dr Artist Pais pt- hasn't been seen since    HPI  Patient is a 32 year old American female who is in today to reestablish care. She had been on lisinopril but developed a sensation of lightheadedness and dizziness. She had some muscle cramps and malaise as well as some headache so she stopped the medication about a month and a half ago. She reports she has tolerated this dull in the past. No high-grade fevers and chills but fatigue and myalgias are noted. She has been using Mucinex over-the-counter with minimal relief. Has been having worsening headaches as the congestion is worsened. She's a cough productive of yellow phlegm. Clear rhinorrhea is also noted. The cough has been present for roughly 3 weeks.   Past Medical History  Diagnosis Date  . Hyperthyroidism     s/p ablation-now with hypothyroidism-sees endocrinology  . Hypertension   . Chicken pox     as a child  . Anemia     iron  . Acute bronchitis 12/04/2012    Past Surgical History  Procedure Laterality Date  . Thyroid surgery      thyroid ablation    Family History  Problem Relation Age of Onset  . Hypertension Mother   . Hyperlipidemia Mother   . Hyperthyroidism Mother   . Hyperlipidemia Maternal Grandmother   . Hypertension Maternal Grandmother   . Cancer Maternal Grandmother     history of breast  . Arthritis Maternal Grandmother   . Stroke Maternal Grandmother   . Diabetes Maternal Grandmother 74    type 2  . Hyperthyroidism Maternal Grandmother     History   Social History  . Marital Status: Single    Spouse Name: N/A    Number of Children: N/A  . Years of Education: N/A   Occupational History  . Not on file.   Social History Main Topics  .  Smoking status: Never Smoker   . Smokeless tobacco: Never Used  . Alcohol Use: No  . Drug Use: No  . Sexually Active: No   Other Topics Concern  . Not on file   Social History Narrative    Occupation:patient is a Engineer, civil (consulting) - RN      single      no children    Current Outpatient Prescriptions on File Prior to Visit  Medication Sig Dispense Refill  . Multiple Vitamin (MULTIVITAMIN PO) Take by mouth daily.         No current facility-administered medications on file prior to visit.    No Known Allergies  Review of Systems  Review of Systems  Constitutional: Positive for malaise/fatigue. Negative for fever and chills.  HENT: Positive for congestion and sore throat. Negative for hearing loss and nosebleeds.   Eyes: Negative for discharge.  Respiratory: Positive for cough and sputum production. Negative for shortness of breath and wheezing.   Cardiovascular: Negative for chest pain, palpitations and leg swelling.  Gastrointestinal: Negative for heartburn, nausea, vomiting, abdominal pain, diarrhea, constipation and blood in stool.  Genitourinary: Negative for dysuria, urgency, frequency and hematuria.  Musculoskeletal: Positive for myalgias. Negative for back pain and falls.  Skin: Negative for rash.  Neurological: Positive for headaches. Negative for dizziness, tremors, sensory change, focal  weakness, loss of consciousness and weakness.  Endo/Heme/Allergies: Negative for polydipsia. Does not bruise/bleed easily.  Psychiatric/Behavioral: Negative for depression and suicidal ideas. The patient is not nervous/anxious and does not have insomnia.     Objective  BP 138/110  Pulse 82  Temp(Src) 98.3 F (36.8 C) (Oral)  Ht 5\' 4"  (1.626 m)  Wt 140 lb 1.9 oz (63.558 kg)  BMI 24.04 kg/m2  SpO2 99%  LMP 11/03/2012  Physical Exam  Physical Exam  Constitutional: She is oriented to person, place, and time and well-developed, well-nourished, and in no distress. No distress.  HENT:   Head: Normocephalic and atraumatic.  Nasal mucosa boggy and erythematous  Eyes: Conjunctivae are normal.  Neck: Neck supple. No thyromegaly present.  Cardiovascular: Normal rate, regular rhythm and normal heart sounds.   No murmur heard. Pulmonary/Chest: Effort normal and breath sounds normal. She has no wheezes.  Abdominal: She exhibits no distension and no mass.  Musculoskeletal: She exhibits no edema.  Lymphadenopathy:    She has cervical adenopathy.  Neurological: She is alert and oriented to person, place, and time.  Skin: Skin is warm and dry. No rash noted. She is not diaphoretic.  Psychiatric: Memory, affect and judgment normal.       Assessment & Plan  HYPERTENSION Has not been taking her meds for past month to 2. Has tolerated Bystolic in past but when patient got to pharmacy coset was prohibitive, will try Coreg bid for now and she will reprot worsening numbers. She stopped the Lisinorpil whe was given because she felt it was making her light headed and has felt better when she stopped it. Encouraged DASH diet  HYPOTHYROIDISM, POSTABLATION Free T4 normal, tsh mildly elevated will continue to monitor  Acute bronchitis Started on Bactrim DS, add a probiotics, Mucinex, increase rest and hydration.

## 2012-12-04 NOTE — Assessment & Plan Note (Signed)
Started on Bactrim DS, add a probiotics, Mucinex, increase rest and hydration.

## 2013-01-11 ENCOUNTER — Ambulatory Visit: Payer: 59 | Admitting: Family Medicine

## 2013-01-26 ENCOUNTER — Telehealth: Payer: Self-pay | Admitting: Family Medicine

## 2013-01-26 MED ORDER — LEVOTHYROXINE SODIUM 75 MCG PO TABS: 75.0000 ug | ORAL_TABLET | Freq: Every day | ORAL | Status: DC

## 2013-01-26 NOTE — Telephone Encounter (Signed)
Refill- levothyroxine tablet. Take one tablet by mouth every day. Qty 30 last fill 4.1.14

## 2013-02-11 ENCOUNTER — Encounter: Payer: Self-pay | Admitting: Family Medicine

## 2013-02-11 ENCOUNTER — Ambulatory Visit (INDEPENDENT_AMBULATORY_CARE_PROVIDER_SITE_OTHER): Payer: 59 | Admitting: Family Medicine

## 2013-02-11 VITALS — BP 158/100 | HR 73 | Temp 98.1°F | Ht 64.0 in | Wt 142.1 lb

## 2013-02-11 DIAGNOSIS — I10 Essential (primary) hypertension: Secondary | ICD-10-CM

## 2013-02-11 DIAGNOSIS — R5381 Other malaise: Secondary | ICD-10-CM

## 2013-02-11 DIAGNOSIS — R5383 Other fatigue: Secondary | ICD-10-CM

## 2013-02-11 DIAGNOSIS — E038 Other specified hypothyroidism: Secondary | ICD-10-CM

## 2013-02-11 MED ORDER — CARVEDILOL 12.5 MG PO TABS: 12.5000 mg | ORAL_TABLET | Freq: Two times a day (BID) | ORAL | Status: DC

## 2013-02-11 NOTE — Patient Instructions (Addendum)
Next visit nurse for bp check with manual cuff  Rel of rec [facility] labs, notes for past 2 years   Hypertension As your heart beats, it forces blood through your arteries. This force is your blood pressure. If the pressure is too high, it is called hypertension (HTN) or high blood pressure. HTN is dangerous because you may have it and not know it. High blood pressure may mean that your heart has to work harder to pump blood. Your arteries may be narrow or stiff. The extra work puts you at risk for heart disease, stroke, and other problems.  Blood pressure consists of two numbers, a higher number over a lower, 110/72, for example. It is stated as "110 over 72." The ideal is below 120 for the top number (systolic) and under 80 for the bottom (diastolic). Write down your blood pressure today. You should pay close attention to your blood pressure if you have certain conditions such as:  Heart failure.  Prior heart attack.  Diabetes  Chronic kidney disease.  Prior stroke.  Multiple risk factors for heart disease. To see if you have HTN, your blood pressure should be measured while you are seated with your arm held at the level of the heart. It should be measured at least twice. A one-time elevated blood pressure reading (especially in the Emergency Department) does not mean that you need treatment. There may be conditions in which the blood pressure is different between your right and left arms. It is important to see your caregiver soon for a recheck. Most people have essential hypertension which means that there is not a specific cause. This type of high blood pressure may be lowered by changing lifestyle factors such as:  Stress.  Smoking.  Lack of exercise.  Excessive weight.  Drug/tobacco/alcohol use.  Eating less salt. Most people do not have symptoms from high blood pressure until it has caused damage to the body. Effective treatment can often prevent, delay or reduce  that damage. TREATMENT  When a cause has been identified, treatment for high blood pressure is directed at the cause. There are a large number of medications to treat HTN. These fall into several categories, and your caregiver will help you select the medicines that are best for you. Medications may have side effects. You should review side effects with your caregiver. If your blood pressure stays high after you have made lifestyle changes or started on medicines,   Your medication(s) may need to be changed.  Other problems may need to be addressed.  Be certain you understand your prescriptions, and know how and when to take your medicine.  Be sure to follow up with your caregiver within the time frame advised (usually within two weeks) to have your blood pressure rechecked and to review your medications.  If you are taking more than one medicine to lower your blood pressure, make sure you know how and at what times they should be taken. Taking two medicines at the same time can result in blood pressure that is too low. SEEK IMMEDIATE MEDICAL CARE IF:  You develop a severe headache, blurred or changing vision, or confusion.  You have unusual weakness or numbness, or a faint feeling.  You have severe chest or abdominal pain, vomiting, or breathing problems. MAKE SURE YOU:   Understand these instructions.  Will watch your condition.  Will get help right away if you are not doing well or get worse. Document Released: 08/18/2005 Document Revised: 11/10/2011 Document Reviewed:  04/07/2008 ExitCare Patient Information 2014 [place].

## 2013-02-12 LAB — LIPID PANEL
Cholesterol: 193 mg/dL (ref 0–200)
HDL: 66 mg/dL (ref >39)
LDL Cholesterol: 118 mg/dL — ABNORMAL HIGH (ref 0–99)
Triglycerides: 43 mg/dL (ref <150)
VLDL: 9 mg/dL (ref 0–40)

## 2013-02-12 LAB — CBC
HCT: 34 % — ABNORMAL LOW (ref 36.0–46.0)
Hemoglobin: 11.5 g/dL — ABNORMAL LOW (ref 12.0–15.0)
RDW: 13.9 % (ref 11.5–15.5)
WBC: 5.1 10*3/uL (ref 4.0–10.5)

## 2013-02-12 LAB — RENAL FUNCTION PANEL
Albumin: 4.6 g/dL (ref 3.5–5.2)
Creat: 0.9 mg/dL (ref 0.50–1.10)
Phosphorus: 3.4 mg/dL (ref 2.3–4.6)

## 2013-02-12 LAB — HEPATIC FUNCTION PANEL
Albumin: 4.6 g/dL (ref 3.5–5.2)
Total Bilirubin: 0.5 mg/dL (ref 0.3–1.2)

## 2013-02-12 LAB — T4, FREE: Free T4: 1.09 ng/dL (ref 0.80–1.80)

## 2013-02-12 LAB — TSH: TSH: 4.843 u[IU]/mL — ABNORMAL HIGH (ref 0.350–4.500)

## 2013-02-13 NOTE — Assessment & Plan Note (Signed)
Unfortunately still poorly controlled at home as well as at office. Has doubled her coreg to 6.25 bid so will increase again to 12.5 bid. Already minimizes salt and caffeine.

## 2013-02-13 NOTE — Progress Notes (Signed)
Patient ID: Claire Haynes, female   DOB: 1981/05/24, 32 y.o.   MRN: [id number] Claire Haynes [id number] Aug 09, 1981 02/13/2013      Progress Note-Follow Up  Subjective  Chief Complaint  Chief Complaint  Patient presents with  . Hypertension    HPI  Patient is a 32 year old American female who is in today for follow hypertension. She's been monitoring her blood pressure elsewhere and he continues to run high. Typically systolics are ranging in the 140s 150s. Diastolics hanging in the 90s. She's had intermittent trouble with headaches as well as visual changes. She has symptoms intermittently in her left eye which are worse when her pressure is up. Virtually blurry vision. She's had trouble off and on since her initial diagnosis of Graves' disease. She has already increased her Coreg from 3.125-6.25 mg twice a day and pressure continues to run high. No complaints of chest pain or palpitations. No shortness of breath, fevers or systemic acute illness are noted.  Past Medical History  Diagnosis Date  . Hyperthyroidism     s/p ablation-now with hypothyroidism-sees endocrinology  . Hypertension   . Chicken pox     as a child  . Anemia     iron  . Acute bronchitis 12/04/2012    Past Surgical History  Procedure Laterality Date  . Thyroid surgery      thyroid ablation    Family History  Problem Relation Age of Onset  . Hypertension Mother   . Hyperlipidemia Mother   . Hyperthyroidism Mother   . Hyperlipidemia Maternal Grandmother   . Hypertension Maternal Grandmother   . Cancer Maternal Grandmother     history of breast  . Arthritis Maternal Grandmother   . Stroke Maternal Grandmother   . Diabetes Maternal Grandmother 74    type 2  . Hyperthyroidism Maternal Grandmother     History   Social History  . Marital Status: Single    Spouse Name: N/A    Number of Children: N/A  . Years of Education: N/A   Occupational History  . Not on file.   Social History Main Topics  .  Smoking status: Never Smoker   . Smokeless tobacco: Never Used  . Alcohol Use: No  . Drug Use: No  . Sexually Active: No   Other Topics Concern  . Not on file   Social History Narrative    Occupation:patient is a Engineer, civil (consulting) - RN      single      no children    Current Outpatient Prescriptions on File Prior to Visit  Medication Sig Dispense Refill  . levothyroxine (SYNTHROID, LEVOTHROID) 75 MCG tablet Take 1 tablet (75 mcg total) by mouth daily.  30 tablet  2  . Multiple Vitamin (MULTIVITAMIN PO) Take by mouth daily.         No current facility-administered medications on file prior to visit.    No Known Allergies  Review of Systems  Review of Systems  Constitutional: Negative for fever and malaise/fatigue.  HENT: Negative for congestion.   Eyes: Positive for blurred vision. Negative for discharge.  Respiratory: Negative for shortness of breath.   Cardiovascular: Negative for chest pain, palpitations and leg swelling.  Gastrointestinal: Negative for nausea, abdominal pain and diarrhea.  Genitourinary: Negative for dysuria.  Musculoskeletal: Negative for falls.  Skin: Negative for rash.  Neurological: Positive for headaches. Negative for loss of consciousness.  Endo/Heme/Allergies: Negative for polydipsia.  Psychiatric/Behavioral: Negative for depression and suicidal ideas. The patient is not nervous/anxious and  does not have insomnia.     Objective  BP 158/100  Pulse 73  Temp(Src) 98.1 F (36.7 C) (Oral)  Ht 5\' 4"  (1.626 m)  Wt 142 lb 1.3 oz (64.447 kg)  BMI 24.38 kg/m2  SpO2 96%  LMP 01/21/2013  Physical Exam  Physical Exam  Constitutional: She is oriented to person, place, and time and well-developed, well-nourished, and in no distress. No distress.  HENT:  Head: Normocephalic and atraumatic.  Eyes: Conjunctivae are normal.  Neck: Neck supple. No thyromegaly present.  Cardiovascular: Normal rate and regular rhythm.  Exam reveals no gallop.   No murmur  heard. Pulmonary/Chest: Effort normal and breath sounds normal. She has no wheezes.  Abdominal: She exhibits no distension and no mass.  Musculoskeletal: She exhibits no edema.  Lymphadenopathy:    She has no cervical adenopathy.  Neurological: She is alert and oriented to person, place, and time.  Skin: Skin is warm and dry. No rash noted. She is not diaphoretic.  Psychiatric: Memory, affect and judgment normal.    Lab Results  Component Value Date   TSH 4.843* 02/11/2013   Lab Results  Component Value Date   WBC 5.1 02/11/2013   HGB 11.5* 02/11/2013   HCT 34.0* 02/11/2013   MCV 87.9 02/11/2013   PLT 234 02/11/2013   Lab Results  Component Value Date   CREATININE 0.90 02/11/2013   BUN 12 02/11/2013   NA 137 02/11/2013   K 3.6 02/11/2013   CL 104 02/11/2013   CO2 24 02/11/2013   Lab Results  Component Value Date   ALT 15 02/11/2013   AST 16 02/11/2013   ALKPHOS 33* 02/11/2013   BILITOT 0.5 02/11/2013   Lab Results  Component Value Date   CHOL 193 02/11/2013   Lab Results  Component Value Date   HDL 66 02/11/2013   Lab Results  Component Value Date   LDLCALC 118* 02/11/2013   Lab Results  Component Value Date   TRIG 43 02/11/2013   Lab Results  Component Value Date   CHOLHDL 2.9 02/11/2013     Assessment & Plan  HYPERTENSION Unfortunately still poorly controlled at home as well as at office. Has doubled her coreg to 6.25 bid so will increase again to 12.5 bid. Already minimizes salt and caffeine.   HYPOTHYROIDISM, POSTABLATION TSH continues to be mildly elevated but her T4 is normal would continue to monitor

## 2013-02-13 NOTE — Assessment & Plan Note (Signed)
TSH continues to be mildly elevated but her T4 is normal would continue to monitor

## 2013-02-14 MED ORDER — VITAMIN D (ERGOCALCIFEROL) 1.25 MG (50000 UNIT) PO CAPS: 50000.0000 [IU] | ORAL_CAPSULE | ORAL | Status: DC

## 2013-02-14 NOTE — Addendum Note (Signed)
Addended by: Court Joy on: 02/14/2013 04:35 PM   Modules accepted: Orders

## 2013-02-17 ENCOUNTER — Telehealth: Payer: Self-pay

## 2013-02-17 DIAGNOSIS — E559 Vitamin D deficiency, unspecified: Secondary | ICD-10-CM

## 2013-02-17 DIAGNOSIS — E038 Other specified hypothyroidism: Secondary | ICD-10-CM

## 2013-02-17 NOTE — Telephone Encounter (Signed)
Lab order placed.

## 2013-06-21 ENCOUNTER — Telehealth: Payer: Self-pay | Admitting: Family Medicine

## 2013-06-21 NOTE — Telephone Encounter (Signed)
Left detailed message for patient to call our office to schedule an appointment with Sandford Craze

## 2013-06-21 NOTE — Telephone Encounter (Signed)
Ok with me 

## 2013-06-21 NOTE — Telephone Encounter (Signed)
Fine with me

## 2013-06-21 NOTE — Telephone Encounter (Signed)
Patient would like to transfer to [facility]. Is this okay?

## 2013-07-07 ENCOUNTER — Other Ambulatory Visit: Payer: Self-pay

## 2017-09-23 ENCOUNTER — Ambulatory Visit: Payer: 59 | Admitting: Family Medicine

## 2017-09-23 ENCOUNTER — Encounter: Payer: Self-pay | Admitting: Family Medicine

## 2017-09-23 ENCOUNTER — Ambulatory Visit (INDEPENDENT_AMBULATORY_CARE_PROVIDER_SITE_OTHER): Payer: Managed Care, Other (non HMO) | Admitting: Family Medicine

## 2017-09-23 VITALS — BP 134/85 | HR 70 | Temp 97.7°F | Ht 67.0 in | Wt 142.4 lb

## 2017-09-23 DIAGNOSIS — I10 Essential (primary) hypertension: Secondary | ICD-10-CM | POA: Diagnosis not present

## 2017-09-23 DIAGNOSIS — L700 Acne vulgaris: Secondary | ICD-10-CM | POA: Diagnosis not present

## 2017-09-23 DIAGNOSIS — E0590 Thyrotoxicosis, unspecified without thyrotoxic crisis or storm: Secondary | ICD-10-CM | POA: Insufficient documentation

## 2017-09-23 DIAGNOSIS — Z7689 Persons encountering health services in other specified circumstances: Secondary | ICD-10-CM | POA: Diagnosis not present

## 2017-09-23 LAB — T4, FREE: Free T4: 0.86 ng/dL (ref 0.60–1.60)

## 2017-09-23 LAB — TSH: TSH: 3.88 u[IU]/mL (ref 0.35–4.50)

## 2017-09-23 LAB — T3, FREE: T3, Free: 2.7 pg/mL (ref 2.3–4.2)

## 2017-09-23 MED ORDER — AMLODIPINE BESYLATE 2.5 MG PO TABS
2.5000 mg | ORAL_TABLET | Freq: Every day | ORAL | 0 refills | Status: DC
Start: 2017-09-23 — End: 2018-01-04

## 2017-09-23 NOTE — Progress Notes (Signed)
Patient ID: Claire Richesoshima T Brandis, female  DOB: 11/05/1980, 37 y.o.   MRN: [id number] Patient Care Team    Relationship Specialty Notifications Start End  Natalia LeatherwoodKuneff, Juanjesus Pepperman A, DO PCP - General Family Medicine  09/23/17     Chief Complaint  Patient presents with  . Establish Care    for her chronic med condition    Subjective:  Claire Haynes is a 37 y.o.  female present for new patient establishment. All past medical history, surgical history, allergies, family history, immunizations, medications and social history were obtained and entered in the electronic medical record today. All recent labs, ED visits and hospitalizations within the last year were reviewed.  Essential hypertension Pt reports compliance with bystolic 5 mg QD. Blood pressures ranges at home normal. She reports sometime she only takes 2.5 if BP lower that day. She is a Engineer, civil (consulting)nurse. She denies palpitations or h/o of tachycardia (excpet when hyperthyroid- before ablation). She repots the bystolic can be expensive and other BB have not worked for her.  Patient denies chest pain, shortness of breath or lower extremity edema. Pt does not take a daily baby ASA. Pt is not prescribed statin. BMP: not available.  CBC:  not available. Lipid:  not available.  Diet: watches her sodium Exercise: routinely.  RF: HTN, Fhx stroke. Fhx HD.  Hyperthyroidism s/p ablation S/p ablation 2009. Has been stable on synthroid 75 mcg QD. No levels available.   Cystic acne vulgaris [place]. Mulitple drug regimen in progress. Request referral to derm.   [facility] 09/23/2017  Decreased Interest 0  Down, Depressed, Hopeless 0  PHQ - 2 Score 0   No flowsheet data found.     No flowsheet data found.   Immunization History  Administered Date(s) Administered  . Influenza Split 06/01/2012  . Influenza Whole 07/20/2009  . Td 01/30/2010  . Tdap 01/30/2010    No exam data present  Past  Medical History:  Diagnosis Date  . Acute bronchitis 12/04/2012  . Anemia    iron  . Chicken pox    as a child  . Hypertension   . Hyperthyroidism    s/p ablation-now with hypothyroidism-sees endocrinology  . Shingles    No Known Allergies Past Surgical History:  Procedure Laterality Date  . THYROID SURGERY     thyroid ablation   Family History  Problem Relation Age of Onset  . Hypertension Mother   . Hyperlipidemia Mother   . Hyperthyroidism Mother   . Hyperlipidemia Maternal Grandmother   . Hypertension Maternal Grandmother   . Cancer Maternal Grandmother        history of breast  . Arthritis Maternal Grandmother   . Stroke Maternal Grandmother   . Diabetes Maternal Grandmother 74       type 2  . Hyperthyroidism Maternal Grandmother   . Breast cancer Maternal Grandmother   . Heart disease Maternal Grandmother   . Lung cancer Maternal Grandfather    Social History   Socioeconomic History  . Marital status: Single    Spouse name: Not on file  . Number of children: Not on file  . Years of education: Not on file  . Highest education level: Not on file  Social Needs  . Financial resource strain: Not on file  . Food insecurity - worry: Not on file  . Food insecurity - inability: Not on file  . Transportation needs - medical: Not on file  . Transportation  needs - non-medical: Not on file  Occupational History  . Not on file  Tobacco Use  . Smoking status: Never Smoker  . Smokeless tobacco: Never Used  Substance and Sexual Activity  . Alcohol use: No  . Drug use: No  . Sexual activity: No  Other Topics Concern  . Not on file  Social History Narrative    Occupation:patient is a Engineer, civil (consulting) - RN      single      no children   Allergies as of 09/23/2017   No Known Allergies     Medication List        Accurate as of 09/23/17 10:35 AM. Always use your most recent med list.          amLODipine 2.5 MG tablet Commonly known as:  NORVASC Take 1 tablet (2.5 mg  total) by mouth daily.   clindamycin 1 % external solution Commonly known as:  CLEOCIN T Apply topically 2 (two) times daily.   doxycycline 100 MG capsule Commonly known as:  VIBRAMYCIN Take 100 mg by mouth 2 (two) times daily.   levothyroxine 75 MCG tablet Commonly known as:  SYNTHROID, LEVOTHROID Take 1 tablet (75 mcg total) by mouth daily.   MULTIVITAMIN PO Take by mouth daily.   TRETINOIN EX Apply topically.       All past medical history, surgical history, allergies, family history, immunizations andmedications were updated in the EMR today and reviewed under the history and medication portions of their EMR.    No results found for this or any previous visit (from the past 2160 hour(s)).  ROS: 14 pt review of systems performed and negative (unless mentioned in an HPI)  Objective: BP 134/85 (BP Location: Left Arm, Patient Position: Sitting, Cuff Size: Normal)   Pulse 70   Temp 97.7 F (36.5 C) (Oral)   Ht 5\' 7"  (1.702 m)   Wt 142 lb 6.4 oz (64.6 kg)   LMP 09/09/2017   SpO2 100%   BMI 22.30 kg/m  Gen: Afebrile. No acute distress. Nontoxic in appearance, well-developed, well-nourished,  Very peasant AAF.  HENT: AT. [place]. Bilateral TM visualized and normal in appearance, normal external auditory canal. MMM, no oral lesions, adequate dentition. Bilateral nares within normal limits. Throat without erythema, ulcerations or exudates. no Cough on exam, no hoarseness on exam. Eyes:Pupils Equal Round Reactive to light, Extraocular movements intact,  Conjunctiva without redness, discharge or icterus. Neck/lymp/endocrine: Supple,no lymphadenopathy, no thyromegaly CV: RRR no murmur, no edema, +2/4 P posterior tibialis pulses. no carotid bruits. No JVD. Chest: CTAB, no wheeze, rhonchi or crackles. normal Respiratory effort. good Air movement. Abd: Soft. BS present.  Skin: Warm and well-perfused. Skin intact. Neuro/Msk: Normal gait. PERLA. EOMi. Alert. Oriented x3.   Psych: Normal  affect, dress and demeanor. Normal speech. Normal thought content and judgment.   Assessment/plan: VADA SWIFT is a 37 y.o. female present for establishment of care. Essential hypertension Borderline pressure today. Pt on BB. She denies h/o palpations or tachycardia since thyroid controlled.  - Discussed options with her today and deciede to try amlodipine, since it would be more cost effective.  - nurse visit 1 week for BP recheck. 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  Hyperthyroidism s/p ablation - unknown last Thyroid panel. Pt has been stable on 75 mcg dosing for awhile.  - medication will be refilled after resutls received.  - TSH -  T3, free - T4, free  Cystic acne vulgaris - Has been under the care of derm, multiple drug regimen for cystic acne  - Ambulatory referral to Dermatology  Nurse visit 1 week for BP recheck.  6 months HTN follow up Yearly physicals   Note is dictated utilizing voice recognition software. Although note has been proof read prior to signing, occasional typographical errors still can be missed. If any questions arise, please do not hesitate to call for verification.  Electronically signed by: Felix Pacini, DO [facility] [place]

## 2017-09-23 NOTE — Patient Instructions (Signed)
Start amlodipine in place of the bystolic. Monitor BP and if > 135/85 routinely, then increase to amlodipine 5 mg (2 tabs). Follow up next week with nurse visit for Bp recheck (make sure to have taken med at least 1-2 hour prior).   I will refill your thyroid medication after lab results to make sure correct dose is prescribed for you.    Follow up every 6 months on hypertension (once stable dose)  and yearly for physicals.    Please help [place] help you:  We are honored you have chosen [facility] for your Primary Care home. Below you will find basic instructions that you may need to access in the future. Please help [place] help you by reading the instructions, which cover many of the frequent questions we experience.   Prescription refills and request:  -In order to allow more efficient response time, please call your pharmacy for all refills. They will forward the request electronically to [place]. This allows for the quickest possible response. Request left on a nurse line can take longer to refill, since these are checked as time allows between office patients and other phone calls.  - refill request can take up to 3-5 working days to complete.  - If request is sent electronically and request is appropiate, it is usually completed in 1-2 business days.  - all patients will need to be seen routinely for all chronic medical conditions requiring prescription medications (see follow-up below). If you are overdue for follow up on your condition, you will be asked to make an appointment and we will call in enough medication to cover you until your appointment (up to 30 days).  - all controlled substances will require a face to face visit to request/refill.  - if you desire your prescriptions to go through a new pharmacy, and have an active script at original pharmacy, you will need to call your pharmacy and have scripts transferred to new pharmacy. This is completed between the pharmacy locations and not by  your provider.    Results: If any images or labs were ordered, it can take up to 1 week to get results depending on the test ordered and the lab/facility running and resulting the test. - Normal or stable results, which do not need further discussion, may be released to your mychart immediately with attached note to you. A call may not be generated for normal results. Please make certain to sign up for mychart. If you have questions on how to activate your mychart you can call the front office.  - If your results need further discussion, our office will attempt to contact you via phone, and if unable to reach you after 2 attempts, we will release your abnormal result to your mychart with instructions.  - All results will be automatically released in mychart after 1 week.  - Your provider will provide you with explanation and instruction on all relevant material in your results. Please keep in mind, results and labs may appear confusing or abnormal to the untrained eye, but it does not mean they are actually abnormal for you personally. If you have any questions about your results that are not covered, or you desire more detailed explanation than what was provided, you should make an appointment with your provider to do so.   Our office handles many outgoing and incoming calls daily. If we have not contacted you within 1 week about your results, please check your mychart to see if there is  a message first and if not, then contact our office.  In helping with this matter, you help decrease call volume, and therefore allow us to be able to respond to patients needs more efficiently.   Acute office visits (sick visit):  An acute visit is intended for a new problem and are scheduled in shorter time slots to allow schedule openings for patients with new problems. This is the appropriate visit to discuss a new problem. In order to provide you with excellent quality medical care with proper time for you to  explain your problem, have an exam and receive treatment with instructions, these appointments should be limited to one new problem per visit. If you experience a new problem, in which you desire to be addressed, please make an acute office visit, we save openings on the schedule to accommodate you. Please do not save your new problem for any other type of visit, let us take care of it properly and quickly for you.   Follow up visits:  Depending on your condition(s) your provider will need to see you routinely in order to provide you with quality care and prescribe medication(s). Most chronic conditions (Example: hypertension, Diabetes, depression/anxiety... etc), require visits a couple times a year. Your provider will instruct you on proper follow up for your personal medical conditions and history. Please make certain to make follow up appointments for your condition as instructed. Failing to do so could result in lapse in your medication treatment/refills. If you request a refill, and are overdue to be seen on a condition, we will always provide you with a 30 day script (once) to allow you time to schedule.    Medicare wellness (well visit): - we have a wonderful Nurse Selena Batten(Kim), that will meet with you and provide you will yearly medicare wellness visits. These visits should occur yearly (can not be scheduled less than 1 calendar year apart) and cover preventive health, immunizations, advance directives and screenings you are entitled to yearly through your medicare benefits. Do not miss out on your entitled benefits, this is when medicare will pay for these benefits to be ordered for you.  These are strongly encouraged by your provider and is the appropriate type of visit to make certain you are up to date with all preventive health benefits. If you have not had your medicare wellness exam in the last 12 months, please make certain to schedule one by calling the office and schedule your medicare wellness  with Selena BattenKim as soon as possible.   Yearly physical (well visit):  - Adults are recommended to be seen yearly for physicals. Check with your insurance and date of your last physical, most insurances require one calendar year between physicals. Physicals include all preventive health topics, screenings, medical exam and labs that are appropriate for gender/age and history. You may have fasting labs needed at this visit. This is a well visit (not a sick visit), new problems should not be covered during this visit (see acute visit).  - Pediatric patients are seen more frequently when they are younger. Your provider will advise you on well child visit timing that is appropriate for your their age. - This is not a medicare wellness visit. Medicare wellness exams do not have an exam portion to the visit. Some medicare companies allow for a physical, some do not allow a yearly physical. If your medicare allows a yearly physical you can schedule the medicare wellness with our nurse Selena BattenKim and have your physical with your  provider after, on the same day. Please check with insurance for your full benefits.   Late Policy/No Shows:  - all new patients should arrive 15-30 minutes earlier than appointment to allow [place] time  to  obtain all personal demographics,  insurance information and for you to complete office paperwork. - All established patients should arrive 10-15 minutes earlier than appointment time to update all information and be checked in .  - In our best efforts to run on time, if you are late for your appointment you will be asked to either reschedule or if able, we will work you back into the schedule. There will be a wait time to work you back in the schedule,  depending on availability.  - If you are unable to make it to your appointment as scheduled, please call 24 hours ahead of time to allow [place] to fill the time slot with someone else who needs to be seen. If you do not cancel your appointment ahead of  time, you may be charged a no show fee.

## 2017-09-24 ENCOUNTER — Telehealth: Payer: Self-pay | Admitting: Family Medicine

## 2017-09-24 MED ORDER — LEVOTHYROXINE SODIUM 75 MCG PO TABS: 75.0000 ug | ORAL_TABLET | Freq: Every day | ORAL | 3 refills | Status: DC

## 2017-09-24 NOTE — Telephone Encounter (Signed)
Spoke with patient reviewed lab results and instructions. Patient verbalized understanding. 

## 2017-09-24 NOTE — Telephone Encounter (Signed)
Thyroid function is normal. Refills provided for 1 year.

## 2017-09-28 ENCOUNTER — Telehealth: Payer: Self-pay | Admitting: Family Medicine

## 2017-09-28 NOTE — Telephone Encounter (Signed)
Please confirm medication with patient (solution etc). We had referred her to dermatology for her acne on establishment. They will manage this medication long term.

## 2017-09-28 NOTE — Telephone Encounter (Signed)
Copied from CRM [id number]. Topic: Inquiry >> Sep 28, 2017 10:44 AM Stephannie LiSimmons, Damarrion Mimbs L, NT wrote: Reason for CRM: Patient calledand would like a refill on clindamycin fosafate topical 1 percent   was filled by Jane CanaryHannah Leonard   [phone]

## 2017-09-28 NOTE — Telephone Encounter (Signed)
Patient was seen as a new patient on 09/23/17. Requesting a refill on medication we have not Rx'd . Do you want to Rx this or refer to dermatology?

## 2017-09-29 NOTE — Telephone Encounter (Signed)
Left message for patient letting her know Dermatology referral had been placed and they will manage long term if out of medication we can refill one time once dosage is confirmed with patient pharmacy. Instructed patient to call back or send MyChart message if needing 1 refill.

## 2017-09-30 ENCOUNTER — Ambulatory Visit: Payer: Managed Care, Other (non HMO)

## 2017-10-05 ENCOUNTER — Ambulatory Visit: Payer: Managed Care, Other (non HMO)

## 2018-01-01 ENCOUNTER — Telehealth: Payer: Self-pay | Admitting: Family Medicine

## 2018-01-01 NOTE — Telephone Encounter (Signed)
Copied from CRM [id number]. Topic: Quick Communication - Rx Refill/Question >> Jan 01, 2018 11:45 AM Oneal Grout wrote: Medication:amLODipine (NORVASC) 2.5 MG tablet , has been taking , requesting  Has the patient contacted their pharmacy? Yes.   (Agent: If no, request that the patient contact the pharmacy for the refill.) Preferred Pharmacy (with phone number or street name): [facility] [address] Agent: Please be advised that RX refills may take up to 3 business days. We ask that you follow-up with your pharmacy.

## 2018-01-04 MED ORDER — AMLODIPINE BESYLATE 5 MG PO TABS: 5.0000 mg | ORAL_TABLET | Freq: Every day | ORAL | 0 refills | Status: DC

## 2018-01-04 NOTE — Telephone Encounter (Signed)
Sent refill on Amlodipine. Patient has scheduled follow up appt in July.

## 2018-03-05 ENCOUNTER — Telehealth: Payer: Self-pay | Admitting: Family Medicine

## 2018-03-05 NOTE — Telephone Encounter (Signed)
Spoke with patient she is due for her 6 month follow up scheduled patient to be seen.

## 2018-03-05 NOTE — Telephone Encounter (Signed)
Copied from CRM [id number]. Topic: Inquiry >> Mar 05, 2018  8:01 AM Tamela OddiMartin, Don'Quashia, NT wrote: Reason for CRM: Patient states that Dr. Flonnie HailstoneKuneuff changed her blood pressure medicine to amLODipine (NORVASC) 5 MG tablet. She states that the medicine is not managing her blood pressure .  She states he last BP was 146/90. She would like it to be change back to the nebivolol (BYSTOLIC) 5MG  tablet.  She would like a call back today to see if this can be done. Patient would like the medicine changed today . CB# [phone].   [facility] [address] - [place], [place] - [id number] [phone] (Phone) [phone] (Fax)

## 2018-03-08 ENCOUNTER — Encounter: Payer: Self-pay | Admitting: Family Medicine

## 2018-03-08 ENCOUNTER — Ambulatory Visit (INDEPENDENT_AMBULATORY_CARE_PROVIDER_SITE_OTHER): Payer: PRIVATE HEALTH INSURANCE | Admitting: Family Medicine

## 2018-03-08 VITALS — BP 135/95 | HR 74 | Temp 98.8°F | Resp 20 | Ht 67.0 in | Wt 145.8 lb

## 2018-03-08 DIAGNOSIS — I10 Essential (primary) hypertension: Secondary | ICD-10-CM | POA: Diagnosis not present

## 2018-03-08 DIAGNOSIS — E0590 Thyrotoxicosis, unspecified without thyrotoxic crisis or storm: Secondary | ICD-10-CM | POA: Diagnosis not present

## 2018-03-08 LAB — BASIC METABOLIC PANEL
BUN: 12 mg/dL (ref 6–23)
CHLORIDE: 104 meq/L (ref 96–112)
CO2: 28 mEq/L (ref 19–32)
CREATININE: 1.04 mg/dL (ref 0.40–1.20)
Calcium: 8.9 mg/dL (ref 8.4–10.5)
GFR: 75.67 mL/min (ref >60.00)
GLUCOSE: 86 mg/dL (ref 70–99)
POTASSIUM: 4 meq/L (ref 3.5–5.1)
Sodium: 138 mEq/L (ref 135–145)

## 2018-03-08 LAB — CBC
HEMATOCRIT: 33.3 % — AB (ref 36.0–46.0)
HEMOGLOBIN: 11.2 g/dL — AB (ref 12.0–15.0)
MCHC: 33.5 g/dL (ref 30.0–36.0)
MCV: 86.6 fl (ref 78.0–100.0)
Platelets: 209 10*3/uL (ref 150.0–400.0)
RBC: 3.84 Mil/uL — ABNORMAL LOW (ref 3.87–5.11)
RDW: 14.4 % (ref 11.5–15.5)
WBC: 4.2 10*3/uL (ref 4.0–10.5)

## 2018-03-08 MED ORDER — NEBIVOLOL HCL 5 MG PO TABS: 5.0000 mg | ORAL_TABLET | Freq: Every day | ORAL | 1 refills | Status: DC

## 2018-03-08 NOTE — Patient Instructions (Addendum)
If BP is not improved to below 130/80 back on bystolic, will need to see you sooner. Otherwise, followup in 6 months.  Low sodium diet and exercise is also helpful.    Hypertension Hypertension, commonly called high blood pressure, is when the force of blood pumping through the arteries is too strong. The arteries are the blood vessels that carry blood from the heart throughout the body. Hypertension forces the heart to work harder to pump blood and may cause arteries to become narrow or stiff. Having untreated or uncontrolled hypertension can cause heart attacks, strokes, kidney disease, and other problems. A blood pressure reading consists of a higher number over a lower number. Ideally, your blood pressure should be below 120/80. The first ("top") number is called the systolic pressure. It is a measure of the pressure in your arteries as your heart beats. The second ("bottom") number is called the diastolic pressure. It is a measure of the pressure in your arteries as the heart relaxes. What are the causes? The cause of this condition is not known. What increases the risk? Some risk factors for high blood pressure are under your control. Others are not. Factors you can change  Smoking.  Having type 2 diabetes mellitus, high cholesterol, or both.  Not getting enough exercise or physical activity.  Being overweight.  Having too much fat, sugar, calories, or salt (sodium) in your diet.  Drinking too much alcohol. Factors that are difficult or impossible to change  Having chronic kidney disease.  Having a family history of high blood pressure.  Age. Risk increases with age.  Race. You may be at higher risk if you are African-American.  Gender. Men are at higher risk than women before age 52. After age 25, women are at higher risk than men.  Having obstructive sleep apnea.  Stress. What are the signs or symptoms? Extremely high blood pressure (hypertensive crisis) may  cause:  Headache.  Anxiety.  Shortness of breath.  Nosebleed.  Nausea and vomiting.  Severe chest pain.  Jerky movements you cannot control (seizures).  How is this diagnosed? This condition is diagnosed by measuring your blood pressure while you are seated, with your arm resting on a surface. The cuff of the blood pressure monitor will be placed directly against the skin of your upper arm at the level of your heart. It should be measured at least twice using the same arm. Certain conditions can cause a difference in blood pressure between your right and left arms. Certain factors can cause blood pressure readings to be lower or higher than normal (elevated) for a short period of time:  When your blood pressure is higher when you are in a health care provider's office than when you are at home, this is called white coat hypertension. Most people with this condition do not need medicines.  When your blood pressure is higher at home than when you are in a health care provider's office, this is called masked hypertension. Most people with this condition may need medicines to control blood pressure.  If you have a high blood pressure reading during one visit or you have normal blood pressure with other risk factors:  You may be asked to return on a different day to have your blood pressure checked again.  You may be asked to monitor your blood pressure at home for 1 week or longer.  If you are diagnosed with hypertension, you may have other blood or imaging tests to help your health care  provider understand your overall risk for other conditions. How is this treated? This condition is treated by making healthy lifestyle changes, such as eating healthy foods, exercising more, and reducing your alcohol intake. Your health care provider may prescribe medicine if lifestyle changes are not enough to get your blood pressure under control, and if:  Your systolic blood pressure is above  130.  Your diastolic blood pressure is above 80.  Your personal target blood pressure may vary depending on your medical conditions, your age, and other factors. Follow these instructions at home: Eating and drinking  Eat a diet that is high in fiber and potassium, and low in sodium, added sugar, and fat. An example eating plan is called the DASH (Dietary Approaches to Stop Hypertension) diet. To eat this way: ? Eat plenty of fresh fruits and vegetables. Try to fill half of your plate at each meal with fruits and vegetables. ? Eat whole grains, such as whole wheat pasta, brown rice, or whole grain bread. Fill about one quarter of your plate with whole grains. ? Eat or drink low-fat dairy products, such as skim milk or low-fat yogurt. ? Avoid fatty cuts of meat, processed or cured meats, and poultry with skin. Fill about one quarter of your plate with lean proteins, such as fish, chicken without skin, beans, eggs, and tofu. ? Avoid premade and processed foods. These tend to be higher in sodium, added sugar, and fat.  Reduce your daily sodium intake. Most people with hypertension should eat less than 1,500 mg of sodium a day.  Limit alcohol intake to no more than 1 drink a day for nonpregnant women and 2 drinks a day for men. One drink equals 12 oz of beer, 5 oz of wine, or 1 oz of hard liquor. Lifestyle  Work with your health care provider to maintain a healthy body weight or to lose weight. Ask what an ideal weight is for you.  Get at least 30 minutes of exercise that causes your heart to beat faster (aerobic exercise) most days of the week. Activities may include walking, swimming, or biking.  Include exercise to strengthen your muscles (resistance exercise), such as pilates or lifting weights, as part of your weekly exercise routine. Try to do these types of exercises for 30 minutes at least 3 days a week.  Do not use any products that contain nicotine or tobacco, such as cigarettes and  e-cigarettes. If you need help quitting, ask your health care provider.  Monitor your blood pressure at home as told by your health care provider.  Keep all follow-up visits as told by your health care provider. This is important. Medicines  Take over-the-counter and prescription medicines only as told by your health care provider. Follow directions carefully. Blood pressure medicines must be taken as prescribed.  Do not skip doses of blood pressure medicine. Doing this puts you at risk for problems and can make the medicine less effective.  Ask your health care provider about side effects or reactions to medicines that you should watch for. Contact a health care provider if:  You think you are having a reaction to a medicine you are taking.  You have headaches that keep coming back (recurring).  You feel dizzy.  You have swelling in your ankles.  You have trouble with your vision. Get help right away if:  You develop a severe headache or confusion.  You have unusual weakness or numbness.  You feel faint.  You have severe pain in  your chest or abdomen.  You vomit repeatedly.  You have trouble breathing. Summary  Hypertension is when the force of blood pumping through your arteries is too strong. If this condition is not controlled, it may put you at risk for serious complications.  Your personal target blood pressure may vary depending on your medical conditions, your age, and other factors. For most people, a normal blood pressure is less than 120/80.  Hypertension is treated with lifestyle changes, medicines, or a combination of both. Lifestyle changes include weight loss, eating a healthy, low-sodium diet, exercising more, and limiting alcohol. This information is not intended to replace advice given to you by your health care provider. Make sure you discuss any questions you have with your health care provider. Document Released: 08/18/2005 Document Revised: 07/16/2016  Document Reviewed: 07/16/2016 Elsevier Interactive Patient Education  [employer]2018 [employer]

## 2018-03-08 NOTE — Progress Notes (Signed)
Patient ID: Claire Haynes, female  DOB: 04/09/1981, 37 y.o.   MRN: [id number] Patient Care Team    Relationship Specialty Notifications Start End  Natalia Leatherwood, DO PCP - General Family Medicine  09/23/17     Chief Complaint  Patient presents with  . Hypertension    Subjective:  Claire Haynes is a 37 y.o.  female present for follow up Essential hypertension Pt reports compliance with amlodipine but feels the Bystolic worked better.  Like to change back to the by systolic.Patient denies chest pain, shortness of breath, dizziness or lower extremity edema. Pt does not take a daily baby ASA. Pt is not prescribed statin. BMP: 03/08/2018 within normal limits CBC: 03/08/2018 with hemoglobin 11.2/hematocrit 33.3 this appears to be her baseline in comparison from 5 years ago. TSH: 09/23/2017 within normal limits Lipid: 02/11/2013 total cholesterol 193, HDL 66, LDL 118, triglycerides 43 Diet: watches her sodium Exercise: routinely.  RF: HTN, Fhx stroke. Fhx HD.  Hyperthyroidism s/p ablation S/p ablation 2009. Has been stable on synthroid 75 mcg QD. Levels normal 09/2017.   Depression screen [facility] 2/9 03/08/2018 09/23/2017  Decreased Interest 0 0  Down, Depressed, Hopeless 0 0  PHQ - 2 Score 0 0   No flowsheet data found.     No flowsheet data found.   Immunization History  Administered Date(s) Administered  . Influenza Split 06/01/2012  . Influenza Whole 07/20/2009  . Td 01/30/2010  . Tdap 01/30/2010    No exam data present  Past Medical History:  Diagnosis Date  . Acute bronchitis 12/04/2012  . Anemia    iron  . Chicken pox    as a child  . Hypertension   . Hyperthyroidism    s/p ablation-now with hypothyroidism  . Shingles    No Known Allergies Past Surgical History:  Procedure Laterality Date  . THYROID SURGERY  2009   thyroid ablation   Family History  Problem Relation Age of Onset  . Hypertension Mother   . Hyperlipidemia Mother   . Hyperthyroidism Mother    . Hyperlipidemia Maternal Grandmother   . Hypertension Maternal Grandmother   . Cancer Maternal Grandmother        history of breast  . Arthritis Maternal Grandmother   . Stroke Maternal Grandmother   . Diabetes Maternal Grandmother 74       type 2  . Hyperthyroidism Maternal Grandmother   . Breast cancer Maternal Grandmother   . Heart disease Maternal Grandmother   . Lung cancer Maternal Grandfather    Social History   Socioeconomic History  . Marital status: Single    Spouse name: Not on file  . Number of children: Not on file  . Years of education: Not on file  . Highest education level: Not on file  Occupational History  . Occupation: Charity fundraiser  Social Needs  . Financial resource strain: Not on file  . Food insecurity:    Worry: Not on file    Inability: Not on file  . Transportation needs:    Medical: Not on file    Non-medical: Not on file  Tobacco Use  . Smoking status: Never Smoker  . Smokeless tobacco: Never Used  Substance and Sexual Activity  . Alcohol use: No  . Drug use: No  . Sexual activity: Never  Lifestyle  . Physical activity:    Days per week: Not on file    Minutes per session: Not on file  . Stress: Not on file  Relationships  . Social connections:    Talks on phone: Not on file    Gets together: Not on file    Attends religious service: Not on file    Active member of club or organization: Not on file    Attends meetings of clubs or organizations: Not on file    Relationship status: Not on file  . Intimate partner violence:    Fear of current or ex partner: Not on file    Emotionally abused: Not on file    Physically abused: Not on file    Forced sexual activity: Not on file  Other Topics Concern  . Not on file  Social History Narrative   Single. College educated. RN.    Exercises routinely.    Drinks caffeine.    Smoke alarm in the home.    Wears seatbelt.    Feels safe in her relationships.    Allergies as of 03/08/2018   No Known  Allergies     Medication List        Accurate as of 03/08/18 11:21 AM. Always use your most recent med list.          amLODipine 5 MG tablet Commonly known as:  NORVASC Take 1 tablet (5 mg total) by mouth daily.   clindamycin 1 % external solution Commonly known as:  CLEOCIN T Apply topically 2 (two) times daily.   doxycycline 100 MG capsule Commonly known as:  VIBRAMYCIN Take 100 mg by mouth 2 (two) times daily.   levothyroxine 75 MCG tablet Commonly known as:  SYNTHROID, LEVOTHROID Take 1 tablet (75 mcg total) by mouth daily.   MULTIVITAMIN PO Take by mouth daily.   TRETINOIN EX Apply topically.       All past medical history, surgical history, allergies, family history, immunizations andmedications were updated in the EMR today and reviewed under the history and medication portions of their EMR.    No results found for this or any previous visit (from the past 2160 hour(s)).  ROS: 14 pt review of systems performed and negative (unless mentioned in an HPI)  Objective: BP (!) 135/95 (BP Location: Left Arm, Patient Position: Sitting, Cuff Size: Normal)   Pulse 74   Temp 98.8 F (37.1 C)   Resp 20   Ht 5\' 7"  (1.702 m)   Wt 145 lb 12.8 oz (66.1 kg)   LMP 02/21/2018   SpO2 100%   BMI 22.84 kg/m  Gen: Afebrile. No acute distress. Nontoxic in appearance, well-developed, well-nourished,  Very peasant AAF.  HENT: AT. [place]. Bilateral TM visualized and normal in appearance, normal external auditory canal. MMM, no oral lesions, adequate dentition. Bilateral nares within normal limits. Throat without erythema, ulcerations or exudates. no Cough on exam, no hoarseness on exam. Eyes:Pupils Equal Round Reactive to light, Extraocular movements intact,  Conjunctiva without redness, discharge or icterus. Neck/lymp/endocrine: Supple,no lymphadenopathy, no thyromegaly CV: RRR no murmur, no edema, +2/4 P posterior tibialis pulses. no carotid bruits. No JVD. Chest: CTAB, no wheeze,  rhonchi or crackles. normal Respiratory effort. good Air movement. Abd: Soft. BS present.  Skin: Warm and well-perfused. Skin intact. Neuro/Msk: Normal gait. PERLA. EOMi. Alert. Oriented x3.   Psych: Normal affect, dress and demeanor. Normal speech. Normal thought content and judgment.   Assessment/plan: Merla Richesoshima T Posa is a 37 y.o. female present for  Essential hypertension Stable.  Patient desires to return to bystolic dose of 5 mg daily.  DC amlodipine.  Prescribed bystolic 5 mg daily. - Basic  Metabolic Panel (BMET) - CBC -Low-sodium, continue routine exercise -Follow-up 6 months   Hyperthyroidism s/p ablation Stable.  TSH normal January 2019.  Continue Synthroid 75 mcg daily.  Note is dictated utilizing voice recognition software. Although note has been proof read prior to signing, occasional typographical errors still can be missed. If any questions arise, please do not hesitate to call for verification.  Electronically signed by: Felix Pacini, DO [facility] [place]

## 2018-03-09 ENCOUNTER — Telehealth: Payer: Self-pay | Admitting: Family Medicine

## 2018-03-09 NOTE — Telephone Encounter (Signed)
Spoke with patient reviewed lab results . Patient verbalized understanding. 

## 2018-03-09 NOTE — Telephone Encounter (Signed)
Please inform patient the following information: Her labs are stable. She has very mild anemia, which has been unchanged in comparison to prior labs.

## 2018-03-11 ENCOUNTER — Encounter: Payer: Self-pay | Admitting: Family Medicine

## 2018-03-23 ENCOUNTER — Ambulatory Visit: Payer: Managed Care, Other (non HMO) | Admitting: Family Medicine

## 2018-09-02 ENCOUNTER — Ambulatory Visit: Payer: PRIVATE HEALTH INSURANCE | Admitting: Family Medicine

## 2018-09-02 DIAGNOSIS — Z0289 Encounter for other administrative examinations: Secondary | ICD-10-CM

## 2018-09-20 ENCOUNTER — Other Ambulatory Visit: Payer: Self-pay | Admitting: Family Medicine

## 2018-09-20 MED ORDER — NEBIVOLOL HCL 5 MG PO TABS: 5.0000 mg | ORAL_TABLET | Freq: Every day | ORAL | 0 refills | Status: DC

## 2018-09-20 NOTE — Telephone Encounter (Signed)
Patient has appointment scheduled 09/29/18. Requested Prescriptions  Pending Prescriptions Disp Refills  . nebivolol (BYSTOLIC) 5 MG tablet 90 tablet 0    Sig: Take 1 tablet (5 mg total) by mouth daily.     Cardiovascular:  Beta Blockers Failed - 09/20/2018 10:21 AM      Failed - Last BP in normal range    BP Readings from Last 1 Encounters:  03/08/18 (!) 135/95         Failed - Valid encounter within last 6 months    Recent Outpatient Visits          6 months ago Essential hypertension   [facility], Renee A, DO   12 months ago Essential hypertension   [facility], Ezequiel Essex, DO      Future Appointments            In 1 week Natalia Leatherwood, DO [facility], [facility]           Passed - Last Heart Rate in normal range    Pulse Readings from Last 1 Encounters:  03/08/18 74

## 2018-09-20 NOTE — Telephone Encounter (Signed)
Copied from CRM [id number]. Topic: Quick Communication - Rx Refill/Question >> Sep 20, 2018 10:12 AM Marylen Ponto wrote: Medication: nebivolol (BYSTOLIC) 5 MG tablet  Has the patient contacted their pharmacy? yes   Preferred Pharmacy (with phone number or street name): [facility] [address] [place], [place] - [address] [phone] (Phone) [phone] (Fax)  Agent: Please be advised that RX refills may take up to 3 business days. We ask that you follow-up with your pharmacy.

## 2018-09-29 ENCOUNTER — Encounter: Payer: Self-pay | Admitting: Family Medicine

## 2018-09-29 ENCOUNTER — Telehealth: Payer: Self-pay | Admitting: Family Medicine

## 2018-09-29 ENCOUNTER — Ambulatory Visit (INDEPENDENT_AMBULATORY_CARE_PROVIDER_SITE_OTHER): Payer: PRIVATE HEALTH INSURANCE | Admitting: Family Medicine

## 2018-09-29 VITALS — BP 125/84 | HR 61 | Temp 98.3°F | Resp 16 | Ht 67.0 in | Wt 146.4 lb

## 2018-09-29 DIAGNOSIS — Z1239 Encounter for other screening for malignant neoplasm of breast: Secondary | ICD-10-CM

## 2018-09-29 DIAGNOSIS — E890 Postprocedural hypothyroidism: Secondary | ICD-10-CM

## 2018-09-29 DIAGNOSIS — I10 Essential (primary) hypertension: Secondary | ICD-10-CM

## 2018-09-29 LAB — T4, FREE: Free T4: 0.88 ng/dL (ref 0.60–1.60)

## 2018-09-29 LAB — TSH: TSH: 3.44 u[IU]/mL (ref 0.35–4.50)

## 2018-09-29 MED ORDER — NEBIVOLOL HCL 5 MG PO TABS: 5.0000 mg | ORAL_TABLET | Freq: Every day | ORAL | 1 refills | Status: DC

## 2018-09-29 MED ORDER — LEVOTHYROXINE SODIUM 75 MCG PO TABS: 75.0000 ug | ORAL_TABLET | Freq: Every day | ORAL | 3 refills | Status: DC

## 2018-09-29 NOTE — Progress Notes (Signed)
Patient ID: Claire Haynes, female  DOB: 10/20/1980, 38 y.o.   MRN: [id number] Patient Care Team    Relationship Specialty Notifications Start End  Natalia LeatherwoodKuneff, Renee A, DO PCP - General Family Medicine  09/23/17   Specialists, Dermatology  Dermatology  09/29/18     Chief Complaint  Patient presents with  . Follow-up    HTN F/U. Pt has been checking BP's at home and has WNL readings,120's/80's. Pt does not have readings with her    Subjective:  Claire Haynes is a 38 y.o.  female present for follow up Essential hypertension Pt reports compliance with bystolic 5 mg. She had been amlodipine but felt the bystolic worked better.  Patient denies chest pain, shortness of breath, dizziness or lower extremity edema.  Pt does not take a daily baby ASA. Pt is not prescribed statin. BMP: 03/08/2018 within normal limits CBC: 03/08/2018 with hemoglobin 11.2/hematocrit 33.3 this appears to be her baseline in comparison from 5 years ago. TSH: 09/23/2017 within normal limits Lipid: 02/11/2013 total cholesterol 193, HDL 66, LDL 118, triglycerides 43 Diet: watches her sodium Exercise: routinely.  RF: HTN, Fhx stroke. Fhx HD.  Hypothyroidism s/p ablation S/p ablation 2009. Has been stable on synthroid 75 mcg QD. Levels normal 09/2017.  Breast cancer screen: Pt would like to start her breast cancer screening as soon as possible, since she is 38. Her PAP is UTD through [facility] 06/2018. MGM fhx.    Depression screen [facility] 2/9 03/08/2018 09/23/2017  Decreased Interest 0 0  Down, Depressed, Hopeless 0 0  PHQ - 2 Score 0 0   No flowsheet data found.     No flowsheet data found.   Immunization History  Administered Date(s) Administered  . Influenza Split 06/01/2012  . Influenza Whole 07/20/2009  . Td 01/30/2010  . Tdap 01/30/2010    No exam data present  Past Medical History:  Diagnosis Date  . Acute bronchitis 12/04/2012  . Anemia    iron  . Chicken pox    as a child  . Hypertension   .  Hyperthyroidism    s/p ablation-now with hypothyroidism  . Shingles    No Known Allergies Past Surgical History:  Procedure Laterality Date  . THYROID SURGERY  2009   thyroid ablation   Family History  Problem Relation Age of Onset  . Hypertension Mother   . Hyperlipidemia Mother   . Hyperthyroidism Mother   . Hyperlipidemia Maternal Grandmother   . Hypertension Maternal Grandmother   . Cancer Maternal Grandmother        history of breast  . Arthritis Maternal Grandmother   . Stroke Maternal Grandmother   . Diabetes Maternal Grandmother 74       type 2  . Hyperthyroidism Maternal Grandmother   . Breast cancer Maternal Grandmother   . Heart disease Maternal Grandmother   . Lung cancer Maternal Grandfather    Social History   Socioeconomic History  . Marital status: Single    Spouse name: Not on file  . Number of children: Not on file  . Years of education: Not on file  . Highest education level: Not on file  Occupational History  . Occupation: Charity fundraiserN  Social Needs  . Financial resource strain: Not on file  . Food insecurity:    Worry: Not on file    Inability: Not on file  . Transportation needs:    Medical: Not on file    Non-medical: Not on file  Tobacco  Use  . Smoking status: Never Smoker  . Smokeless tobacco: Never Used  Substance and Sexual Activity  . Alcohol use: No  . Drug use: No  . Sexual activity: Never  Lifestyle  . Physical activity:    Days per week: Not on file    Minutes per session: Not on file  . Stress: Not on file  Relationships  . Social connections:    Talks on phone: Not on file    Gets together: Not on file    Attends religious service: Not on file    Active member of club or organization: Not on file    Attends meetings of clubs or organizations: Not on file    Relationship status: Not on file  . Intimate partner violence:    Fear of current or ex partner: Not on file    Emotionally abused: Not on file    Physically abused: Not  on file    Forced sexual activity: Not on file  Other Topics Concern  . Not on file  Social History Narrative   Single. College educated. RN.    Exercises routinely.    Drinks caffeine.    Smoke alarm in the home.    Wears seatbelt.    Feels safe in her relationships.    Allergies as of 09/29/2018   No Known Allergies     Medication List       Accurate as of September 29, 2018  9:12 AM. Always use your most recent med list.        clindamycin 1 % external solution Commonly known as:  CLEOCIN T Apply topically 2 (two) times daily.   doxycycline 100 MG capsule Commonly known as:  VIBRAMYCIN Take 100 mg by mouth 2 (two) times daily.   levothyroxine 75 MCG tablet Commonly known as:  SYNTHROID, LEVOTHROID Take 1 tablet (75 mcg total) by mouth daily.   MULTIVITAMIN PO Take by mouth daily.   nebivolol 5 MG tablet Commonly known as:  BYSTOLIC Take 1 tablet (5 mg total) by mouth daily.   TRETINOIN EX Apply topically.       All past medical history, surgical history, allergies, family history, immunizations andmedications were updated in the EMR today and reviewed under the history and medication portions of their EMR.    No results found for this or any previous visit (from the past 2160 hour(s)).  ROS: 14 pt review of systems performed and negative (unless mentioned in an HPI)  Objective: BP 125/84 (BP Location: Left Arm, Patient Position: Sitting, Cuff Size: Normal)   Pulse 61   Temp 98.3 F (36.8 C) (Oral)   Resp 16   Ht 5\' 7"  (1.702 m)   Wt 146 lb 6 oz (66.4 kg)   LMP 09/29/2018 (Exact Date)   SpO2 100%   BMI 22.93 kg/m  Gen: Afebrile. No acute distress.  HENT: AT. [place].  MMM.  Eyes:Pupils Equal Round Reactive to light, Extraocular movements intact,  Conjunctiva without redness, discharge or icterus. Neck/lymp/endocrine: Supple,no lymphadenopathy, no thyromegaly CV: RRR no  murmur, no edema, +2/4 P posterior tibialis pulses Chest: CTAB, no wheeze or  crackles Skin: no rashes, purpura or petechiae.  Neuro:  Normal gait. PERLA. EOMi. Alert. Oriented.  Psych: Normal affect, dress and demeanor. Normal speech. Normal thought content and judgment.    Assessment/plan: Claire Haynes is a 38 y.o. female present for  Essential hypertension Stable.  Continue bystolic 5 mg qd - labs next visit.  -Low-sodium, continue routine  exercise -Follow-up 6 months   Hyperthyroidism s/p ablation stable. Currently taking levothyroxine 75 mcg a day. TSH normal January 2019.   - TSH, T4 collected today - refills will be provided after results.  Breast cancer screening - placed mammogram order for her to start screening.  - MM 3D SCREEN BREAST BILATERAL; Future - encouraged her to schedule her 6 month appt as a physical so we can cover her preventive health completely for her.    Note is dictated utilizing voice recognition software. Although note has been proof read prior to signing, occasional typographical errors still can be missed. If any questions arise, please do not hesitate to call for verification.  Electronically signed by: Felix Pacini, DO [facility] [place]

## 2018-09-29 NOTE — Telephone Encounter (Signed)
Please inform patient the following information: Thyroid is normal. Refills on levothyroxine called in for her.

## 2018-09-29 NOTE — Patient Instructions (Signed)
They will call to schedule your mammogram.  Next appt in 6 mos schedule as Physical- come fasting at least 6 hours- water only if able.  I have refilled your meds for you today to last you until next appt.    Please help [place] help you:  We are honored you have chosen [facility] for your Primary Care home. Below you will find basic instructions that you may need to access in the future. Please help [place] help you by reading the instructions, which cover many of the frequent questions we experience.   Prescription refills and request:  -In order to allow more efficient response time, please call your pharmacy for all refills. They will forward the request electronically to [place]. This allows for the quickest possible response. Request left on a nurse line can take longer to refill, since these are checked as time allows between office patients and other phone calls.  - refill request can take up to 3-5 working days to complete.  - If request is sent electronically and request is appropiate, it is usually completed in 1-2 business days.  - all patients will need to be seen routinely for all chronic medical conditions requiring prescription medications (see follow-up below). If you are overdue for follow up on your condition, you will be asked to make an appointment and we will call in enough medication to cover you until your appointment (up to 30 days).  - all controlled substances will require a face to face visit to request/refill.  - if you desire your prescriptions to go through a new pharmacy, and have an active script at original pharmacy, you will need to call your pharmacy and have scripts transferred to new pharmacy. This is completed between the pharmacy locations and not by your provider.    Results: If any images or labs were ordered, it can take up to 1 week to get results depending on the test ordered and the lab/facility running and resulting the test. - Normal or stable results, which  do not need further discussion, may be released to your mychart immediately with attached note to you. A call may not be generated for normal results. Please make certain to sign up for mychart. If you have questions on how to activate your mychart you can call the front office.  - If your results need further discussion, our office will attempt to contact you via phone, and if unable to reach you after 2 attempts, we will release your abnormal result to your mychart with instructions.  - All results will be automatically released in mychart after 1 week.  - Your provider will provide you with explanation and instruction on all relevant material in your results. Please keep in mind, results and labs may appear confusing or abnormal to the untrained eye, but it does not mean they are actually abnormal for you personally. If you have any questions about your results that are not covered, or you desire more detailed explanation than what was provided, you should make an appointment with your provider to do so.   Our office handles many outgoing and incoming calls daily. If we have not contacted you within 1 week about your results, please check your mychart to see if there is a message first and if not, then contact our office.  In helping with this matter, you help decrease call volume, and therefore allow [place] to be able to respond to patients needs more efficiently.   Acute office visits (  sick visit):  An acute visit is intended for a new problem and are scheduled in shorter time slots to allow schedule openings for patients with new problems. This is the appropriate visit to discuss a new problem. Problems will not be addressed by phone call or Echart message. Appointment is needed if requesting treatment. In order to provide you with excellent quality medical care with proper time for you to explain your problem, have an exam and receive treatment with instructions, these appointments should be limited to  one new problem per visit. If you experience a new problem, in which you desire to be addressed, please make an acute office visit, we save openings on the schedule to accommodate you. Please do not save your new problem for any other type of visit, let us take care of it properly and quickly for you.   Follow up visits:  Depending on your condition(s) your provider will need to see you routinely in order to provide you with quality care and prescribe medication(s). Most chronic conditions (Example: hypertension, Diabetes, depression/anxiety... etc), require visits a couple times a year. Your provider will instruct you on proper follow up for your personal medical conditions and history. Please make certain to make follow up appointments for your condition as instructed. Failing to do so could result in lapse in your medication treatment/refills. If you request a refill, and are overdue to be seen on a condition, we will always provide you with a 30 day script (once) to allow you time to schedule.    Medicare wellness (well visit): - we have a wonderful Nurse Selena Batten), that will meet with you and provide you will yearly medicare wellness visits. These visits should occur yearly (can not be scheduled less than 1 calendar year apart) and cover preventive health, immunizations, advance directives and screenings you are entitled to yearly through your medicare benefits. Do not miss out on your entitled benefits, this is when medicare will pay for these benefits to be ordered for you.  These are strongly encouraged by your provider and is the appropriate type of visit to make certain you are up to date with all preventive health benefits. If you have not had your medicare wellness exam in the last 12 months, please make certain to schedule one by calling the office and schedule your medicare wellness with Selena Batten as soon as possible.   Yearly physical (well visit):  - Adults are recommended to be seen yearly for  physicals. Check with your insurance and date of your last physical, most insurances require one calendar year between physicals. Physicals include all preventive health topics, screenings, medical exam and labs that are appropriate for gender/age and history. You may have fasting labs needed at this visit. This is a well visit (not a sick visit), new problems should not be covered during this visit (see acute visit).  - Pediatric patients are seen more frequently when they are younger. Your provider will advise you on well child visit timing that is appropriate for your their age. - This is not a medicare wellness visit. Medicare wellness exams do not have an exam portion to the visit. Some medicare companies allow for a physical, some do not allow a yearly physical. If your medicare allows a yearly physical you can schedule the medicare wellness with our nurse Selena Batten and have your physical with your provider after, on the same day. Please check with insurance for your full benefits.   Late Policy/No Shows:  - all new  patients should arrive 15-30 minutes earlier than appointment to allow [place] time  to  obtain all personal demographics,  insurance information and for you to complete office paperwork. - All established patients should arrive 10-15 minutes earlier than appointment time to update all information and be checked in .  - In our best efforts to run on time, if you are late for your appointment you will be asked to either reschedule or if able, we will work you back into the schedule. There will be a wait time to work you back in the schedule,  depending on availability.  - If you are unable to make it to your appointment as scheduled, please call 24 hours ahead of time to allow [place] to fill the time slot with someone else who needs to be seen. If you do not cancel your appointment ahead of time, you may be charged a no show fee.

## 2018-09-30 NOTE — Telephone Encounter (Signed)
Pt was called and detailed message with results was left on patients cellphone voicemail, OKAY per [facility]

## 2018-10-25 ENCOUNTER — Encounter: Payer: Self-pay | Admitting: Physician Assistant

## 2018-10-25 ENCOUNTER — Telehealth: Payer: Self-pay | Admitting: Family Medicine

## 2018-10-25 ENCOUNTER — Ambulatory Visit (INDEPENDENT_AMBULATORY_CARE_PROVIDER_SITE_OTHER): Payer: PRIVATE HEALTH INSURANCE | Admitting: Physician Assistant

## 2018-10-25 VITALS — BP 120/86 | HR 74 | Temp 98.5°F | Ht 67.0 in | Wt 147.0 lb

## 2018-10-25 DIAGNOSIS — R6889 Other general symptoms and signs: Secondary | ICD-10-CM

## 2018-10-25 LAB — POCT INFLUENZA A/B
INFLUENZA A, POC: NEGATIVE
INFLUENZA B, POC: NEGATIVE

## 2018-10-25 MED ORDER — HYDROCOD POLST-CPM POLST ER 10-8 MG/5ML PO SUER: 5.0000 mL | Freq: Every evening | ORAL | 0 refills | Status: DC | PRN

## 2018-10-25 NOTE — Patient Instructions (Signed)
It was great to see you!  You have a viral upper respiratory infection. Antibiotics are not needed for this.  Viral infections usually take 7-10 days to resolve.  The cough can last a few weeks to go away.  Use medication as prescribed: tussionex cough syrup  Push fluids and get plenty of rest. Please return if you are not improving as expected, or if you have high fevers (>101.5) or difficulty swallowing or worsening productive cough.  Call clinic with questions.  I hope you start feeling better soon!

## 2018-10-25 NOTE — Progress Notes (Signed)
Claire Haynes is a 38 y.o. female here for a new problem.  History of Present Illness:   Chief Complaint  Patient presents with  . Cough    x 3days, Flem yellow in color   . Chest/lung pain    starting yesterday    Cough  This is a new problem. The current episode started in the past 7 days. The problem has been gradually worsening. The problem occurs constantly. The cough is productive of purulent sputum. Associated symptoms include chest pain (pain with deep inspiration, no pain with activity), ear congestion, rhinorrhea and a sore throat. Pertinent negatives include no chills, ear pain, fever, headaches, heartburn, hemoptysis, shortness of breath or sweats.    Past Medical History:  Diagnosis Date  . Acute bronchitis 12/04/2012  . Anemia    iron  . Chicken pox    as a child  . Hypertension   . Hyperthyroidism    s/p ablation-now with hypothyroidism  . Shingles      Social History   Socioeconomic History  . Marital status: Single    Spouse name: Not on file  . Number of children: Not on file  . Years of education: Not on file  . Highest education level: Not on file  Occupational History  . Occupation: Charity fundraiser  Social Needs  . Financial resource strain: Not on file  . Food insecurity:    Worry: Not on file    Inability: Not on file  . Transportation needs:    Medical: Not on file    Non-medical: Not on file  Tobacco Use  . Smoking status: Never Smoker  . Smokeless tobacco: Never Used  Substance and Sexual Activity  . Alcohol use: No  . Drug use: No  . Sexual activity: Never  Lifestyle  . Physical activity:    Days per week: Not on file    Minutes per session: Not on file  . Stress: Not on file  Relationships  . Social connections:    Talks on phone: Not on file    Gets together: Not on file    Attends religious service: Not on file    Active member of club or organization: Not on file    Attends meetings of clubs or organizations: Not on file     Relationship status: Not on file  . Intimate partner violence:    Fear of current or ex partner: Not on file    Emotionally abused: Not on file    Physically abused: Not on file    Forced sexual activity: Not on file  Other Topics Concern  . Not on file  Social History Narrative   Single. College educated. RN.    Exercises routinely.    Drinks caffeine.    Smoke alarm in the home.    Wears seatbelt.    Feels safe in her relationships.     Past Surgical History:  Procedure Laterality Date  . THYROID SURGERY  2009   thyroid ablation    Family History  Problem Relation Age of Onset  . Hypertension Mother   . Hyperlipidemia Mother   . Hyperthyroidism Mother   . Hyperlipidemia Maternal Grandmother   . Hypertension Maternal Grandmother   . Arthritis Maternal Grandmother   . Stroke Maternal Grandmother   . Diabetes Maternal Grandmother 74       type 2  . Hyperthyroidism Maternal Grandmother   . Breast cancer Maternal Grandmother   . Heart disease Maternal Grandmother   . Lung  cancer Maternal Grandfather     No Known Allergies  Current Medications:   Current Outpatient Medications:  .  clindamycin (CLEOCIN T) 1 % external solution, Apply topically 2 (two) times daily., Disp: , Rfl:  .  doxycycline (VIBRAMYCIN) 100 MG capsule, Take 100 mg by mouth 2 (two) times daily., Disp: , Rfl:  .  guaiFENesin (MUCINEX) 600 MG 12 hr tablet, Take by mouth 2 (two) times daily., Disp: , Rfl:  .  levothyroxine (SYNTHROID, LEVOTHROID) 75 MCG tablet, Take 1 tablet (75 mcg total) by mouth daily., Disp: 90 tablet, Rfl: 3 .  Multiple Vitamin (MULTIVITAMIN PO), Take by mouth daily.  , Disp: , Rfl:  .  nebivolol (BYSTOLIC) 5 MG tablet, Take 1 tablet (5 mg total) by mouth daily., Disp: 90 tablet, Rfl: 1 .  TRETINOIN EX, Apply topically., Disp: , Rfl:  .  chlorpheniramine-HYDROcodone (TUSSIONEX PENNKINETIC ER) 10-8 MG/5ML SUER, Take 5 mLs by mouth at bedtime as needed for cough., Disp: 70 mL, Rfl: 0    Review of Systems:   Review of Systems  Constitutional: Negative for chills and fever.  HENT: Positive for rhinorrhea and sore throat. Negative for ear pain.   Respiratory: Positive for cough. Negative for hemoptysis and shortness of breath.   Cardiovascular: Positive for chest pain (pain with deep inspiration, no pain with activity).  Gastrointestinal: Negative for heartburn.  Neurological: Negative for headaches.    Vitals:   Vitals:   10/25/18 0942  BP: 120/86  Pulse: 74  Temp: 98.5 F (36.9 C)  TempSrc: Oral  SpO2: 99%  Weight: 147 lb (66.7 kg)  Height: 5\' 7"  (1.702 m)     Body mass index is 23.02 kg/m.  Physical Exam:   Physical Exam Vitals signs and nursing note reviewed.  Constitutional:      General: She is not in acute distress.    Appearance: She is well-developed. She is not ill-appearing or toxic-appearing.  HENT:     Head: Normocephalic and atraumatic.     Right Ear: Tympanic membrane, ear canal and external ear normal. Tympanic membrane is not erythematous, retracted or bulging.     Left Ear: Tympanic membrane, ear canal and external ear normal. Tympanic membrane is not erythematous, retracted or bulging.     Nose: Mucosal edema, congestion and rhinorrhea present.     Right Sinus: No maxillary sinus tenderness or frontal sinus tenderness.     Left Sinus: No maxillary sinus tenderness or frontal sinus tenderness.     Mouth/Throat:     Lips: Pink.     Mouth: Mucous membranes are moist.     Pharynx: Uvula midline. Posterior oropharyngeal erythema present.  Eyes:     General: Lids are normal.     Conjunctiva/sclera: Conjunctivae normal.  Neck:     Trachea: Trachea normal.  Cardiovascular:     Rate and Rhythm: Normal rate and regular rhythm.     Pulses: Normal pulses.     Heart sounds: Normal heart sounds, S1 normal and S2 normal.     Comments: No LE edema Pulmonary:     Effort: Pulmonary effort is normal.     Breath sounds: Normal breath sounds.  No decreased breath sounds, wheezing, rhonchi or rales.  Lymphadenopathy:     Cervical: No cervical adenopathy.  Skin:    General: Skin is warm and dry.  Neurological:     Mental Status: She is alert.     GCS: GCS eye subscore is 4. GCS verbal subscore is 5. GCS  motor subscore is 6.  Psychiatric:        Speech: Speech normal.        Behavior: Behavior normal. Behavior is cooperative.     Results for orders placed or performed in visit on 10/25/18  POCT Influenza A/B  Result Value Ref Range   Influenza A, POC Negative Negative   Influenza B, POC Negative Negative    Assessment and Plan:   Sayoko was seen today for cough and chest/lung pain.  Diagnoses and all orders for this visit:  Flu-like symptoms -     POCT Influenza A/B  Other orders -     chlorpheniramine-HYDROcodone (TUSSIONEX PENNKINETIC ER) 10-8 MG/5ML SUER; Take 5 mLs by mouth at bedtime as needed for cough.   No red flags on exam.  Suspect viral URI. Flu test negative. She is on oral doxycycline for her acne. Discussed taking medications as prescribed. Reviewed return precautions including worsening fever, SOB, worsening cough or other concerns. Push fluids and rest. I recommend that patient follow-up if symptoms worsen or persist despite treatment x 7-10 days, sooner if needed.  . Reviewed expectations re: course of current medical issues. . Discussed self-management of symptoms. . Outlined signs and symptoms indicating need for more acute intervention. . Patient verbalized understanding and all questions were answered. . See orders for this visit as documented in the electronic medical record. . Patient received an After-Visit Summary.  CMA or LPN served as scribe during this visit. History, Physical, and Plan performed by medical provider. The above documentation has been reviewed and is accurate and complete.   Jarold Motto, PA-C

## 2018-10-25 NOTE — Telephone Encounter (Signed)
Copied from CRM [id number]. Topic: Quick Communication - Rx Refill/Question >> Oct 25, 2018 12:00 PM Wyonia Hough E wrote: Medication: chlorpheniramine-HYDROcodone Stevphen Meuse PENNKINETIC ER) 10-8 MG/5ML SUER  Pharmacy stated that the prescription was for too much and they can only fill for 7 days/advised by Vira Blanco to advise pharmacy to change it to a 7 day supply

## 2019-02-02 ENCOUNTER — Telehealth: Payer: Self-pay | Admitting: Family Medicine

## 2019-02-02 NOTE — Telephone Encounter (Signed)
Called and spoke with pharmacy about Bystolic. Pt should have one remaining refill of medication on file. Per pharmacy there is a refill on file and they made a mistake. They will refill medication. Pt was called and given information

## 2019-02-02 NOTE — Telephone Encounter (Signed)
Copied from CRM [id number]. Topic: Quick Communication - Rx Refill/Question >> Feb 02, 2019 10:09 AM [name] wrote: Medication: nebivolol (BYSTOLIC) 5 MG tablet  Has the patient contacted their pharmacy? No refills.  Preferred Pharmacy (with phone number or street name): [facility] [address] [place], [place] - [address] [phone] (Phone) [phone] (Fax)    Agent: Please be advised that RX refills may take up to 3 business days. We ask that you follow-up with your pharmacy.

## 2019-03-31 ENCOUNTER — Encounter: Payer: PRIVATE HEALTH INSURANCE | Admitting: Family Medicine

## 2019-04-18 ENCOUNTER — Telehealth: Payer: Self-pay | Admitting: Family Medicine

## 2019-04-18 NOTE — Telephone Encounter (Signed)
Patient called and said that her [place] [phone] has changed the manufacturer of the levothyroxine (SYNTHROID, LEVOTHROID) 75 MCG tablet and need permission from the her doctor before they can fill it, she said she has been out of it for a week. I told patient I would send a message to the nurse to get them to look into it.

## 2019-04-18 NOTE — Telephone Encounter (Signed)
Called pharm and they have filled medication for patient

## 2019-09-19 ENCOUNTER — Telehealth: Payer: Self-pay | Admitting: Family Medicine

## 2019-09-19 NOTE — Telephone Encounter (Signed)
Pt returned call with updated insurance information. Pt was advised we would do our best to get this done today but it could take up to 72 hours for the insurance company to approve this. Pt states she was mad because she needs her medication, pt advised she could go to pharmacy and pay out of pocket for a week or so of medication.

## 2019-09-19 NOTE — Telephone Encounter (Signed)
Patient states she is out of meds. She states that insurance is requiring a Prior Auth.   Patient is very upset. She demanded this be taken care of today. I told her that I could not guarantee, but I would get a message back to Dr. Claiborne Billings clinical staff.  nebivolol (BYSTOLIC) 5 MG tablet [[id number]]   [facility] [address] [place], [place] [address]     Please call patient at [phone]

## 2019-09-19 NOTE — Telephone Encounter (Signed)
Pt was called and asked for updated insurance information in order to do prior auth. Pt was asked if another MD was RXing this medication because she should be out of medication. Pt never picked up last refill and is trying to get it now. She said she would have to go to car and get her insurance information and she would call back.

## 2019-09-22 NOTE — Telephone Encounter (Signed)
Received fax that medication was approved. Scanned into chart. Pt called and notified of approval.

## 2019-10-06 ENCOUNTER — Telehealth: Payer: Self-pay

## 2019-10-06 NOTE — Telephone Encounter (Signed)
Pt was called and told she would need to contact insurance company and find out what medication would be a Tier 1 that is comparable to this medication. Pt was also advised it has been over a year since she has been seen and would need a visit in order to get refills for this medication. Appt was made and she will bring list of meds with her to appt or call with that information to note in her chart.

## 2019-10-06 NOTE — Telephone Encounter (Signed)
Patient is requesting another med prescribed in the place of  nebivolol (BYSTOLIC) 5 MG tablet  This drug is now considered a Tier 2 drug on her insurance plan and patients states that it costs too much for her.  Patient can be reached at [phone].

## 2019-10-13 ENCOUNTER — Other Ambulatory Visit: Payer: Self-pay

## 2019-10-13 ENCOUNTER — Telehealth: Payer: Self-pay | Admitting: Family Medicine

## 2019-10-13 ENCOUNTER — Encounter: Payer: Self-pay | Admitting: Family Medicine

## 2019-10-13 ENCOUNTER — Ambulatory Visit (INDEPENDENT_AMBULATORY_CARE_PROVIDER_SITE_OTHER): Payer: Managed Care, Other (non HMO) | Admitting: Family Medicine

## 2019-10-13 VITALS — BP 109/72 | HR 86 | Temp 98.3°F | Resp 18 | Ht 67.0 in | Wt 147.1 lb

## 2019-10-13 DIAGNOSIS — I10 Essential (primary) hypertension: Secondary | ICD-10-CM | POA: Diagnosis not present

## 2019-10-13 DIAGNOSIS — E890 Postprocedural hypothyroidism: Secondary | ICD-10-CM | POA: Diagnosis not present

## 2019-10-13 LAB — LIPID PANEL
Cholesterol: 211 mg/dL — ABNORMAL HIGH (ref 0–200)
HDL: 67.7 mg/dL (ref >39.00)
LDL Cholesterol: 135 mg/dL — ABNORMAL HIGH (ref 0–99)
NonHDL: 143.7
Total CHOL/HDL Ratio: 3
Triglycerides: 43 mg/dL (ref 0.0–149.0)
VLDL: 8.6 mg/dL (ref 0.0–40.0)

## 2019-10-13 LAB — CBC
HCT: 34.3 % — ABNORMAL LOW (ref 36.0–46.0)
Hemoglobin: 11.3 g/dL — ABNORMAL LOW (ref 12.0–15.0)
MCHC: 32.8 g/dL (ref 30.0–36.0)
MCV: 88.7 fl (ref 78.0–100.0)
Platelets: 261 10*3/uL (ref 150.0–400.0)
RBC: 3.86 Mil/uL — ABNORMAL LOW (ref 3.87–5.11)
RDW: 15.2 % (ref 11.5–15.5)
WBC: 4 10*3/uL (ref 4.0–10.5)

## 2019-10-13 LAB — BASIC METABOLIC PANEL
BUN: 12 mg/dL (ref 6–23)
CO2: 26 mEq/L (ref 19–32)
Calcium: 9.2 mg/dL (ref 8.4–10.5)
Chloride: 105 mEq/L (ref 96–112)
Creatinine, Ser: 0.98 mg/dL (ref 0.40–1.20)
GFR: 75.64 mL/min (ref >60.00)
Glucose, Bld: 81 mg/dL (ref 70–99)
Potassium: 4.3 mEq/L (ref 3.5–5.1)
Sodium: 137 mEq/L (ref 135–145)

## 2019-10-13 LAB — T3, FREE: T3, Free: 2.6 pg/mL (ref 2.3–4.2)

## 2019-10-13 LAB — TSH: TSH: 3.94 u[IU]/mL (ref 0.35–4.50)

## 2019-10-13 LAB — T4, FREE: Free T4: 0.71 ng/dL (ref 0.60–1.60)

## 2019-10-13 MED ORDER — LEVOTHYROXINE SODIUM 75 MCG PO TABS: 75.0000 ug | ORAL_TABLET | Freq: Every day | ORAL | 3 refills | Status: DC

## 2019-10-13 MED ORDER — ATENOLOL 25 MG PO TABS: 12.5000 mg | ORAL_TABLET | Freq: Every day | ORAL | 1 refills | Status: DC

## 2019-10-13 NOTE — Patient Instructions (Addendum)
COVID-19 Vaccine Information can be found at: [email or web address] For questions related to vaccine distribution or appointments, please email [email or web address] or call [phone].  Covid Vaccine appointment go to [email or web address]  Great to see you today.  I have refilled your meds.  Will call you with lab results.   Follow up in 6 months.

## 2019-10-13 NOTE — Telephone Encounter (Signed)
Please inform patient Her kidney function is normal. Blood cell counts are stable from prior collections. Thyroid panel is normal.  I did call in refills for her today at her current dose. Her cholesterol panel was at goal.  Her total cholesterol was 211, her HDL/good cholesterol was 67.  Her bad cholesterol/LDL was 135.

## 2019-10-13 NOTE — Progress Notes (Signed)
Patient ID: Claire Haynes, female  DOB: Mar 11, 1981, 39 y.o.   MRN: [id number] Patient Care Team    Relationship Specialty Notifications Start End  Natalia Leatherwood, DO PCP - General Family Medicine  09/23/17   Specialists, Dermatology  Dermatology  09/29/18   Gynecology, [facility]etric[facility]  Obstetrics and Gynecology  09/29/18     Chief Complaint  Patient presents with  . Hypertension    Has not had medication in a couple of weeks.     Subjective: Claire Haynes is a 39 y.o.  female present for follow up Essential hypertension Pt reports she has been out of her Bystolic for a few weeks.  She was surprised by her blood pressure level today being so good.  She states she has had mostly mildly elevated blood pressures on Bystolic.  Unfortunately her insurance will no longer cover Bystolic.  She had been amlodipine but felt the bystolic worked better. Patient denies chest pain, shortness of breath, dizziness or lower extremity edema.  Pt does not take a daily baby ASA. Pt is not prescribed statin. Labs due. Diet: watches her sodium Exercise: routinely.  RF: HTN, Fhx stroke. Fhx HD.  Hypothyroidism s/p ablation S/p ablation 2009. Has been stable on synthroid 75 mcg QD. Pt reports compliance with medication daily.. Lab recheck due today.   Depression screen [facility] 2/9 03/08/2018 09/23/2017  Decreased Interest 0 0  Down, Depressed, Hopeless 0 0  PHQ - 2 Score 0 0   No flowsheet data found.     No flowsheet data found.   Immunization History  Administered Date(s) Administered  . Influenza Split 06/01/2012  . Influenza Whole 07/20/2009  . Influenza, Seasonal, Injecte, Preservative Fre 06/03/2019  . Td 01/30/2010  . Tdap 01/30/2010    No exam data present  Past Medical History:  Diagnosis Date  . Acute bronchitis 12/04/2012  . Anemia    iron  . Chicken pox    as a child  . Hypertension   . Hyperthyroidism    s/p ablation-now with hypothyroidism  . Shingles    No Known  Allergies Past Surgical History:  Procedure Laterality Date  . THYROID SURGERY  2009   thyroid ablation   Family History  Problem Relation Age of Onset  . Hypertension Mother   . Hyperlipidemia Mother   . Hyperthyroidism Mother   . Hyperlipidemia Maternal Grandmother   . Hypertension Maternal Grandmother   . Arthritis Maternal Grandmother   . Stroke Maternal Grandmother   . Diabetes Maternal Grandmother 74       type 2  . Hyperthyroidism Maternal Grandmother   . Breast cancer Maternal Grandmother   . Heart disease Maternal Grandmother   . Lung cancer Maternal Grandfather    Social History   Socioeconomic History  . Marital status: Single    Spouse name: Not on file  . Number of children: Not on file  . Years of education: Not on file  . Highest education level: Not on file  Occupational History  . Occupation: Charity fundraiser  Tobacco Use  . Smoking status: Never Smoker  . Smokeless tobacco: Never Used  Substance and Sexual Activity  . Alcohol use: No  . Drug use: No  . Sexual activity: Never  Other Topics Concern  . Not on file  Social History Narrative   Single. College educated. RN.    Exercises routinely.    Drinks caffeine.    Smoke alarm in the home.    Wears  seatbelt.    Feels safe in her relationships.    Social Determinants of Health   Financial Resource Strain:   . Difficulty of Paying Living Expenses: Not on file  Food Insecurity:   . Worried About Charity fundraiser in the Last Year: Not on file  . Ran Out of Food in the Last Year: Not on file  Transportation Needs:   . Lack of Transportation (Medical): Not on file  . Lack of Transportation (Non-Medical): Not on file  Physical Activity:   . Days of Exercise per Week: Not on file  . Minutes of Exercise per Session: Not on file  Stress:   . Feeling of Stress : Not on file  Social Connections:   . Frequency of Communication with Friends and Family: Not on file  . Frequency of Social Gatherings with  Friends and Family: Not on file  . Attends Religious Services: Not on file  . Active Member of Clubs or Organizations: Not on file  . Attends Archivist Meetings: Not on file  . Marital Status: Not on file  Intimate Partner Violence:   . Fear of Current or Ex-Partner: Not on file  . Emotionally Abused: Not on file  . Physically Abused: Not on file  . Sexually Abused: Not on file   Allergies as of 10/13/2019   No Known Allergies     Medication List       Accurate as of October 13, 2019 11:08 AM. If you have any questions, ask your nurse or doctor.        STOP taking these medications   chlorpheniramine-HYDROcodone 10-8 MG/5ML Suer Commonly known as: Tussionex Pennkinetic ER Stopped by: Howard Pouch, DO   doxycycline 100 MG capsule Commonly known as: VIBRAMYCIN Stopped by: Howard Pouch, DO   Mucinex 600 MG 12 hr tablet Generic drug: guaiFENesin Stopped by: Howard Pouch, DO     TAKE these medications   clindamycin 1 % external solution Commonly known as: CLEOCIN T Apply topically daily.   levothyroxine 75 MCG tablet Commonly known as: SYNTHROID Take 1 tablet (75 mcg total) by mouth daily.   MULTIVITAMIN PO Take by mouth daily.   nebivolol 5 MG tablet Commonly known as: Bystolic Take 1 tablet (5 mg total) by mouth daily.   TRETINOIN EX Apply topically.       All past medical history, surgical history, allergies, family history, immunizations andmedications were updated in the EMR today and reviewed under the history and medication portions of their EMR.    No results found for this or any previous visit (from the past 2160 hour(s)).  ROS: 14 pt review of systems performed and negative (unless mentioned in an HPI)  Objective: BP 109/72 (BP Location: Left Arm, Patient Position: Sitting, Cuff Size: Normal)   Pulse 86   Temp 98.3 F (36.8 C) (Temporal)   Resp 18   Ht 5\' 7"  (1.702 m)   Wt 147 lb 2 oz (66.7 kg)   LMP 09/27/2019 (Exact Date)    SpO2 100%   BMI 23.04 kg/m  Gen: Afebrile. No acute distress.  Nontoxic.  Very pleasant African-American female. HENT: AT. DISH.  Eyes:Pupils Equal Round Reactive to light, Extraocular movements intact,  Conjunctiva without redness, discharge or icterus. Neck/lymp/endocrine: Supple, no lymphadenopathy, no thyromegaly CV: RRR no murmur, no edema Chest: CTAB, no wheeze or crackles  Neuro:  Normal gait. PERLA. EOMi. Alert. Oriented x3 Psych: Normal affect, dress and demeanor. Normal speech. Normal thought content and judgment.Marland Kitchen  Assessment/plan: Claire Haynes is a 38 y.o. female present for  Essential hypertension -Instructed to monitor her blood pressure over the next few days.  If blood pressure remains less than 130/80 do not recommend restarting the medication.  f greater than 130/80 start atenolol 12.5 mg daily.  Monitor for 3 additional days and if still remains elevated increase to atenolol 25 mg daily. Low-sodium diet. Routine exercise. CBC, CMP, TSH and lipids (nonfasting) collected today.  Hyperthyroidism s/p ablation Stable.  Currently taking levothyroxine 75 mcg a day.  TSH, T3, T4 collected today. Refills will be provided after results received.  Return in about 6 months (around 04/11/2020) for [facility] (30 min). Orders Placed This Encounter  Procedures  . TSH  . T4, free  . T3, free  . Basic Metabolic Panel (BMET)  . CBC  . Lipid panel   Meds ordered this encounter  Medications  . atenolol (TENORMIN) 25 MG tablet    Sig: Take 0.5-1 tablets (12.5-25 mg total) by mouth daily.    Dispense:  90 tablet    Refill:  1     Note is dictated utilizing voice recognition software. Although note has been proof read prior to signing, occasional typographical errors still can be missed. If any questions arise, please do not hesitate to call for verification.  Electronically signed by: Felix Pacini, DO [facility] [place]

## 2020-09-28 ENCOUNTER — Telehealth: Payer: Self-pay

## 2020-09-28 NOTE — Telephone Encounter (Signed)
Note: Patient returned call and scheduled appt for 10/04/20.

## 2020-09-28 NOTE — Telephone Encounter (Signed)
noted 

## 2020-09-28 NOTE — Telephone Encounter (Signed)
Patient is requesting a prescription for  DOXYCYLINE 100mg  x2 day  30d/s.  Dr. referred her to a dermatologist however, they cannot see her until end of Feb.  Dr. Mar has not prescribed this med to her.  She is aware this is an unusual request, and Dr. Claiborne Billings may not prescribe med, but just wanted to ask.  She did not want to go into detail and requested to speak to clinical staff.     Please call patient at [phone]

## 2020-09-28 NOTE — Telephone Encounter (Signed)
Pt has not seen PCP since 10/2019. Pt also has not seen pt for this issue at that time. Requesting this rx is not appropriate and will need to see provider to be evaluated. LVM for pt to CB regarding rx request.   Please advise if needed

## 2020-10-04 ENCOUNTER — Telehealth: Payer: Self-pay

## 2020-10-04 ENCOUNTER — Ambulatory Visit: Payer: Managed Care, Other (non HMO) | Admitting: Family Medicine

## 2020-10-04 NOTE — Telephone Encounter (Signed)
appt \\cancelled .   [facility] Day - Client Nonclinical Telephone Record  [facility] Client [facility] Day - Client Client Site [facility] - Day Physician Claiborne Billings, [place] Contact Type Call Who Is Calling Patient / Member / Family / Caregiver Caller Name Nikka Hakimian Caller Phone Number [phone] Patient Name Claire Haynes Patient DOB [birth year] Call Type Message Only Information Provided Reason for Call Request to [facility] Appointment Initial Comment Caller is needing to cancel her appointment for today. Disp. Time Disposition Final User 10/04/2020 6:13:45 AM General Information Provided Yes Marianna Fuss Call Closed By: Marianna Fuss Transaction Date/Time: 10/04/2020 6:09:58 AM (ET)

## 2020-10-30 ENCOUNTER — Telehealth: Payer: Self-pay

## 2020-10-30 MED ORDER — LEVOTHYROXINE SODIUM 75 MCG PO TABS: 75.0000 ug | ORAL_TABLET | Freq: Every day | ORAL | 0 refills | Status: DC

## 2020-10-30 NOTE — Telephone Encounter (Signed)
30 d/s sent 

## 2020-10-30 NOTE — Telephone Encounter (Signed)
Patient refill request.  Patient scheduled appt with Dr. Claiborne Billings on 11/09/20  levothyroxine (SYNTHROID) 75 MCG tablet [[id number]]    [facility] [address] [place], [place] - [address]

## 2020-11-06 ENCOUNTER — Telehealth: Payer: Self-pay

## 2020-11-06 NOTE — Telephone Encounter (Signed)
Pt is going to need labs. She can be virtual, as long as she does not mind coming in for lab appt within a week if the virtual visit.

## 2020-11-06 NOTE — Telephone Encounter (Signed)
Last OV: 10/13/19 [facility]: hypothyroidism, HTN Last lab complete 10/13/19 F/u due 04/11/20  Please advise if next OV can be VV

## 2020-11-06 NOTE — Telephone Encounter (Signed)
-----   Message from [facility] sent at 11/06/2020  3:29 PM EST ----- Regarding: in office change to virtual appt Patient was scheduled for 3/11 with Dr. Claiborne Billings.  She was returning my call to reschedule appt.  Patient rescheduled her appt for Monday 3/14 at [facility], but requests to have a virtual appt.  Can this visit be approved to be a virtual appt?  Thank you! Annabelle Harman

## 2020-11-06 NOTE — Telephone Encounter (Signed)
Please inform pt of message below. Thanks

## 2020-11-07 NOTE — Telephone Encounter (Signed)
Left message for patient to call back regarding scheduled appt on 3/14

## 2020-11-09 ENCOUNTER — Ambulatory Visit: Payer: Managed Care, Other (non HMO) | Admitting: Family Medicine

## 2020-11-12 ENCOUNTER — Telehealth (INDEPENDENT_AMBULATORY_CARE_PROVIDER_SITE_OTHER): Payer: Managed Care, Other (non HMO) | Admitting: Family Medicine

## 2020-11-12 ENCOUNTER — Other Ambulatory Visit: Payer: Self-pay | Admitting: Family Medicine

## 2020-11-12 ENCOUNTER — Encounter: Payer: Self-pay | Admitting: Family Medicine

## 2020-11-12 VITALS — BP 112/77 | HR 75

## 2020-11-12 DIAGNOSIS — L819 Disorder of pigmentation, unspecified: Secondary | ICD-10-CM | POA: Diagnosis not present

## 2020-11-12 DIAGNOSIS — I10 Essential (primary) hypertension: Secondary | ICD-10-CM | POA: Diagnosis not present

## 2020-11-12 DIAGNOSIS — L700 Acne vulgaris: Secondary | ICD-10-CM | POA: Diagnosis not present

## 2020-11-12 DIAGNOSIS — E890 Postprocedural hypothyroidism: Secondary | ICD-10-CM

## 2020-11-12 DIAGNOSIS — Z1231 Encounter for screening mammogram for malignant neoplasm of breast: Secondary | ICD-10-CM

## 2020-11-12 DIAGNOSIS — F439 Reaction to severe stress, unspecified: Secondary | ICD-10-CM

## 2020-11-12 MED ORDER — CLINDAMYCIN PHOSPHATE 1 % EX GEL: CUTANEOUS | 5 refills | Status: DC

## 2020-11-12 NOTE — Progress Notes (Signed)
VIRTUAL VISIT VIA VIDEO  I connected with Claire Haynes on 11/12/20 at  2:00 PM EDT by a video enabled telemedicine application and verified that I am speaking with the correct person using two identifiers. Location patient: Home Location provider: [facility], Office Persons participating in the virtual visit: Patient, Dr. Raoul Pitch and Cyndra Numbers, CMA  I discussed the limitations of evaluation and management by telemedicine and the availability of in person appointments. The patient expressed understanding and agreed to proceed.    Patient ID: Claire Haynes, female  DOB: 1981/04/12, 40 y.o.   MRN: [id number] Patient Care Team    Relationship Specialty Notifications Start End  Ma Hillock, DO PCP - General Family Medicine  09/23/17   Specialists, Dermatology  Dermatology  09/29/18   Gynecology, [facility]  Obstetrics and Gynecology  09/29/18     Chief Complaint  Patient presents with  . Follow-up    [facility];     Subjective: Claire Haynes is a 40 y.o.  female present for follow up [facility] Essential hypertension Pt reports she has changed her diet back in March of last year. Since that time she has stopped the medication and reports her BP has been great without it.   Hypothyroidism s/p ablation S/p ablation 2009. Has been stable on synthroid 75 mcg QD. Pt reports compliance with medication daily. Labs are due.   Situational stress/concerns: Pt is asking for a referral to a therapist to discuss her current circumstances/feelings surrounding the pandemic and current state of our [place], in regards to how it is causing her to feel stressed.   Acne: Her acne had been doing rather well after changing her diet until January. In [place] she had a significant breakout and restarted the clinda gel and doxy BID. She would like a referral to a new dermatologist to establish.    Depression screen [facility] 2/9 11/12/2020 03/08/2018 09/23/2017  Decreased Interest 0 0 0  Down,  Depressed, Hopeless 0 0 0  PHQ - 2 Score 0 0 0   No flowsheet data found.     No flowsheet data found.   Immunization History  Administered Date(s) Administered  . Influenza Split 06/01/2012  . Influenza Whole 07/20/2009  . Influenza, Quadrivalent, Recombinant, Inj, Pf 06/30/2020  . Influenza, Seasonal, Injecte, Preservative Fre 06/03/2019  . Moderna Sars-Covid-2 Vaccination 01/14/2020, 02/11/2020  . Td 10/13/1995, 01/30/2010  . Tdap 01/30/2010    No exam data present  Past Medical History:  Diagnosis Date  . Acute bronchitis 12/04/2012  . Anemia    iron  . Chicken pox    as a child  . Hypertension    improved and was able to remove meds after dietary changes  . Hypertension   . Hyperthyroidism    s/p ablation-now with hypothyroidism  . Left ventricular hypertrophy 03/22/2012  . Shingles    No Known Allergies Past Surgical History:  Procedure Laterality Date  . THYROID SURGERY  2009   thyroid ablation   Family History  Problem Relation Age of Onset  . Hypertension Mother   . Hyperlipidemia Mother   . Hyperthyroidism Mother   . Hyperlipidemia Maternal Grandmother   . Hypertension Maternal Grandmother   . Arthritis Maternal Grandmother   . Stroke Maternal Grandmother   . Diabetes Maternal Grandmother 74       type 2  . Hyperthyroidism Maternal Grandmother   . Breast cancer Maternal Grandmother   . Heart disease Maternal Grandmother   .  Lung cancer Maternal Grandfather    Social History   Socioeconomic History  . Marital status: Single    Spouse name: Not on file  . Number of children: Not on file  . Years of education: Not on file  . Highest education level: Not on file  Occupational History  . Occupation: Therapist, sports  Tobacco Use  . Smoking status: Never Smoker  . Smokeless tobacco: Never Used  Vaping Use  . Vaping Use: Never used  Substance and Sexual Activity  . Alcohol use: No  . Drug use: No  . Sexual activity: Never  Other Topics Concern  . Not  on file  Social History Narrative   Single. College educated. RN.    Exercises routinely.    Drinks caffeine.    Smoke alarm in the home.    Wears seatbelt.    Feels safe in her relationships.    Social Determinants of Health   Financial Resource Strain: Not on file  Food Insecurity: Not on file  Transportation Needs: Not on file  Physical Activity: Not on file  Stress: Not on file  Social Connections: Not on file  Intimate Partner Violence: Not on file   Allergies as of 11/12/2020   No Known Allergies     Medication List       Accurate as of November 12, 2020  2:28 PM. If you have any questions, ask your nurse or doctor.        STOP taking these medications   atenolol 25 MG tablet Commonly known as: TENORMIN Stopped by: Howard Pouch, DO   TRETINOIN EX Stopped by: Howard Pouch, DO     TAKE these medications   clindamycin 1 % gel Commonly known as: CLINDAGEL APPLY THIN LAYER TOPICALLY TO AFFECTED AREA TWICE DAILY What changed: Another medication with the same name was removed. Continue taking this medication, and follow the directions you see here. Changed by: Howard Pouch, DO   doxycycline 100 MG EC tablet Commonly known as: DORYX Take 100 mg by mouth 2 (two) times daily.   levothyroxine 75 MCG tablet Commonly known as: SYNTHROID Take 1 tablet (75 mcg total) by mouth daily.   MULTIVITAMIN PO Take by mouth daily.       All past medical history, surgical history, allergies, family history, immunizations andmedications were updated in the EMR today and reviewed under the history and medication portions of their EMR.    No results found for this or any previous visit (from the past 2160 hour(s)).  ROS: 14 pt review of systems performed and negative (unless mentioned in an HPI)  Objective: BP 112/77   Pulse 75   LMP 11/12/2020  Gen: Afebrile. No acute distress.  HENT: AT. [place].  Eyes:Pupils Equal Round Reactive to light, Extraocular movements intact,   Conjunctiva without redness, discharge or icterus. Chest: no cough Neuro:  Alert. Oriented x3 Psych: Normal affect, dress and demeanor. Normal speech. Normal thought content and judgment..   Assessment/plan: Claire Haynes is a 40 y.o. female present for  Essential hypertension -resolved with dietary changes. Congratulated her.  Low-sodium diet. Routine exercise. CBC, CMP, TSH and lipids order today for lab appt only.   Hyperthyroidism s/p ablation Has been stable.  Continue levothyroxine 75 mcg a day.  TSH, T3, T4 f ordered today.  Refills will be provided after results received.   Cystic acne vulgaris/Hyperpigmentation - Ambulatory referral to Dermatology - bridged her clinda-gel for her until she gets in to dermatology  Situational stress -  Ambulatory referral to Psychology   Return in about 1 year (around 11/12/2021) for CPE (30 min). Orders Placed This Encounter  Procedures  . TSH  . T4, free  . T3, free  . CBC  . Comp Met (CMET)  . Lipid panel  . Ambulatory referral to Dermatology  . Ambulatory referral to Psychology   Meds ordered this encounter  Medications  . clindamycin (CLINDAGEL) 1 % gel    Sig: APPLY THIN LAYER TOPICALLY TO AFFECTED AREA TWICE DAILY    Dispense:  30 g    Refill:  5     Note is dictated utilizing voice recognition software. Although note has been proof read prior to signing, occasional typographical errors still can be missed. If any questions arise, please do not hesitate to call for verification.  Electronically signed by: Howard Pouch, DO [place]

## 2020-11-21 ENCOUNTER — Other Ambulatory Visit: Payer: Self-pay

## 2020-11-21 ENCOUNTER — Ambulatory Visit (INDEPENDENT_AMBULATORY_CARE_PROVIDER_SITE_OTHER): Payer: Managed Care, Other (non HMO)

## 2020-11-21 ENCOUNTER — Telehealth: Payer: Self-pay | Admitting: Family Medicine

## 2020-11-21 DIAGNOSIS — E890 Postprocedural hypothyroidism: Secondary | ICD-10-CM

## 2020-11-21 DIAGNOSIS — I10 Essential (primary) hypertension: Secondary | ICD-10-CM | POA: Diagnosis not present

## 2020-11-21 LAB — LIPID PANEL
Cholesterol: 230 mg/dL — ABNORMAL HIGH (ref 0–200)
HDL: 88 mg/dL (ref >39.00)
LDL Cholesterol: 134 mg/dL — ABNORMAL HIGH (ref 0–99)
NonHDL: 141.56
Total CHOL/HDL Ratio: 3
Triglycerides: 40 mg/dL (ref 0.0–149.0)
VLDL: 8 mg/dL (ref 0.0–40.0)

## 2020-11-21 LAB — CBC
HCT: 33.6 % — ABNORMAL LOW (ref 36.0–46.0)
Hemoglobin: 11.2 g/dL — ABNORMAL LOW (ref 12.0–15.0)
MCHC: 33.2 g/dL (ref 30.0–36.0)
MCV: 85.9 fl (ref 78.0–100.0)
Platelets: 243 10*3/uL (ref 150.0–400.0)
RBC: 3.91 Mil/uL (ref 3.87–5.11)
RDW: 15.6 % — ABNORMAL HIGH (ref 11.5–15.5)
WBC: 3.5 10*3/uL — ABNORMAL LOW (ref 4.0–10.5)

## 2020-11-21 LAB — COMPREHENSIVE METABOLIC PANEL
ALT: 15 U/L (ref 0–35)
AST: 16 U/L (ref 0–37)
Albumin: 4.6 g/dL (ref 3.5–5.2)
Alkaline Phosphatase: 41 U/L (ref 39–117)
BUN: 15 mg/dL (ref 6–23)
CO2: 28 mEq/L (ref 19–32)
Calcium: 9.1 mg/dL (ref 8.4–10.5)
Chloride: 103 mEq/L (ref 96–112)
Creatinine, Ser: 0.98 mg/dL (ref 0.40–1.20)
GFR: 71.35 mL/min (ref >60.00)
Glucose, Bld: 91 mg/dL (ref 70–99)
Potassium: 3.8 mEq/L (ref 3.5–5.1)
Sodium: 138 mEq/L (ref 135–145)
Total Bilirubin: 0.6 mg/dL (ref 0.2–1.2)
Total Protein: 7.3 g/dL (ref 6.0–8.3)

## 2020-11-21 LAB — T3, FREE: T3, Free: 2.4 pg/mL (ref 2.3–4.2)

## 2020-11-21 LAB — T4, FREE: Free T4: 0.81 ng/dL (ref 0.60–1.60)

## 2020-11-21 LAB — TSH: TSH: 5.99 u[IU]/mL — ABNORMAL HIGH (ref 0.35–4.50)

## 2020-11-21 NOTE — Telephone Encounter (Signed)
Patient requesting refill of doxycycline. Please send to same pharmacy.

## 2020-11-21 NOTE — Telephone Encounter (Signed)
Spoke with pt who stated that rx was discussed during VV and did say during visit that she did not need refill but due to not having appt with derm until May she will need a refill on doxy BID. Please advise

## 2020-11-22 ENCOUNTER — Other Ambulatory Visit: Payer: Self-pay | Admitting: Family Medicine

## 2020-11-22 MED ORDER — DOXYCYCLINE HYCLATE 100 MG PO TBEC: 100.0000 mg | DELAYED_RELEASE_TABLET | Freq: Two times a day (BID) | ORAL | 1 refills | Status: DC

## 2020-11-22 MED ORDER — LEVOTHYROXINE SODIUM 88 MCG PO TABS: 88.0000 ug | ORAL_TABLET | Freq: Every day | ORAL | 0 refills | Status: DC

## 2020-11-22 NOTE — Telephone Encounter (Addendum)
No problem.  Please advise patient doxycycline prescription refilled for her.  Also please inform her on call back: Her urine kidney function are normal. Cholesterol panel is at goal.  Her total cholesterol appears high at 230, however this is because her good cholesterol/HDL is 88.  The high number of HDL, which is good and desired, tries of the total cholesterol number for good reason. Her blood cell counts are stable   Her thyroid is under replaced.  I have called in her thyroid medication at the next higher dose of levothyroxine 88 mcg-1 tab daily on an empty stomach. -We will need to recheck her thyroid levels in 8 weeks to ensure adequately supplemented on new dose.  Please schedule her for lab appointment only at that time.

## 2020-11-22 NOTE — Addendum Note (Signed)
Addended by: Felix Pacini A on: 11/22/2020 08:11 AM   Modules accepted: Orders

## 2020-11-22 NOTE — Telephone Encounter (Signed)
Informed pt of rx. Spoke with pt regarding labs and instructions. Lab sched

## 2021-01-03 ENCOUNTER — Other Ambulatory Visit: Payer: Self-pay

## 2021-01-03 ENCOUNTER — Ambulatory Visit
Admission: RE | Admit: 2021-01-03 | Discharge: 2021-01-03 | Disposition: A | Discharge status: Home / Self Care | Payer: Managed Care, Other (non HMO) | Source: Ambulatory Visit | Attending: Family Medicine | Admitting: Family Medicine

## 2021-01-03 DIAGNOSIS — Z1231 Encounter for screening mammogram for malignant neoplasm of breast: Secondary | ICD-10-CM | POA: Diagnosis not present

## 2021-01-10 ENCOUNTER — Ambulatory Visit (INDEPENDENT_AMBULATORY_CARE_PROVIDER_SITE_OTHER): Payer: BC Managed Care – PPO | Admitting: Psychology

## 2021-01-10 DIAGNOSIS — F4321 Adjustment disorder with depressed mood: Secondary | ICD-10-CM

## 2021-01-14 ENCOUNTER — Ambulatory Visit: Payer: 59 | Admitting: Psychology

## 2021-01-25 ENCOUNTER — Ambulatory Visit: Payer: Managed Care, Other (non HMO)

## 2021-01-30 ENCOUNTER — Ambulatory Visit: Payer: BC Managed Care – PPO | Admitting: Psychology

## 2021-01-30 ENCOUNTER — Ambulatory Visit: Payer: BC Managed Care – PPO

## 2021-02-04 ENCOUNTER — Other Ambulatory Visit: Payer: Self-pay

## 2021-02-04 ENCOUNTER — Ambulatory Visit (INDEPENDENT_AMBULATORY_CARE_PROVIDER_SITE_OTHER): Payer: BC Managed Care – PPO

## 2021-02-04 DIAGNOSIS — E890 Postprocedural hypothyroidism: Secondary | ICD-10-CM | POA: Diagnosis not present

## 2021-02-05 ENCOUNTER — Telehealth: Payer: Self-pay | Admitting: Family Medicine

## 2021-02-05 ENCOUNTER — Other Ambulatory Visit (INDEPENDENT_AMBULATORY_CARE_PROVIDER_SITE_OTHER): Payer: BC Managed Care – PPO

## 2021-02-05 DIAGNOSIS — E039 Hypothyroidism, unspecified: Secondary | ICD-10-CM

## 2021-02-05 LAB — TSH: TSH: 5.94 u[IU]/mL — ABNORMAL HIGH (ref 0.35–4.50)

## 2021-02-05 MED ORDER — LEVOTHYROXINE SODIUM 100 MCG PO TABS: 100.0000 ug | ORAL_TABLET | Freq: Every day | ORAL | 0 refills | Status: DC

## 2021-02-05 NOTE — Telephone Encounter (Signed)
Please call patient: Her thyroid levels did not change at all, despite the higher dose prescribed-still 5.9.   If she reports she is taking the medication daily on an empty stomach, then the only reason levels unchanged is due to her thyroid decreasing function.  Unfortunately, that means we will need to increase her dose again.  I have called in levothyroxine 100 mcg daily on an empty stomach.  Avoid eating, drinking or taking other medications at least 30 minutes after taking thyroid pill.  Follow-up with provider in 2 months.

## 2021-02-06 NOTE — Telephone Encounter (Signed)
Spoke with pt regarding labs and instructions.   

## 2021-02-12 ENCOUNTER — Ambulatory Visit (INDEPENDENT_AMBULATORY_CARE_PROVIDER_SITE_OTHER): Payer: BC Managed Care – PPO | Admitting: Psychology

## 2021-02-12 DIAGNOSIS — F4321 Adjustment disorder with depressed mood: Secondary | ICD-10-CM

## 2021-02-26 ENCOUNTER — Ambulatory Visit: Payer: BC Managed Care – PPO | Admitting: Psychology

## 2021-03-13 DIAGNOSIS — L700 Acne vulgaris: Secondary | ICD-10-CM | POA: Diagnosis not present

## 2021-04-01 DIAGNOSIS — U071 COVID-19: Secondary | ICD-10-CM

## 2021-04-01 HISTORY — DX: COVID-19: U07.1

## 2021-04-03 ENCOUNTER — Ambulatory Visit: Payer: BC Managed Care – PPO | Admitting: Psychology

## 2021-06-07 ENCOUNTER — Telehealth: Payer: Self-pay

## 2021-06-07 IMAGING — MG DIGITAL SCREENING BILAT W/ CAD
5 series · 5 of 5 positions shown · non-contrast
Comparison: None.

CLINICAL DATA: Screening.

EXAM:
DIGITAL SCREENING BILATERAL MAMMOGRAM WITH CAD
TECHNIQUE: Bilateral screening digital craniocaudal and mediolateral oblique
mammograms were obtained. The images were evaluated with
computer-aided detection.

[R MLO (1 of 2)]
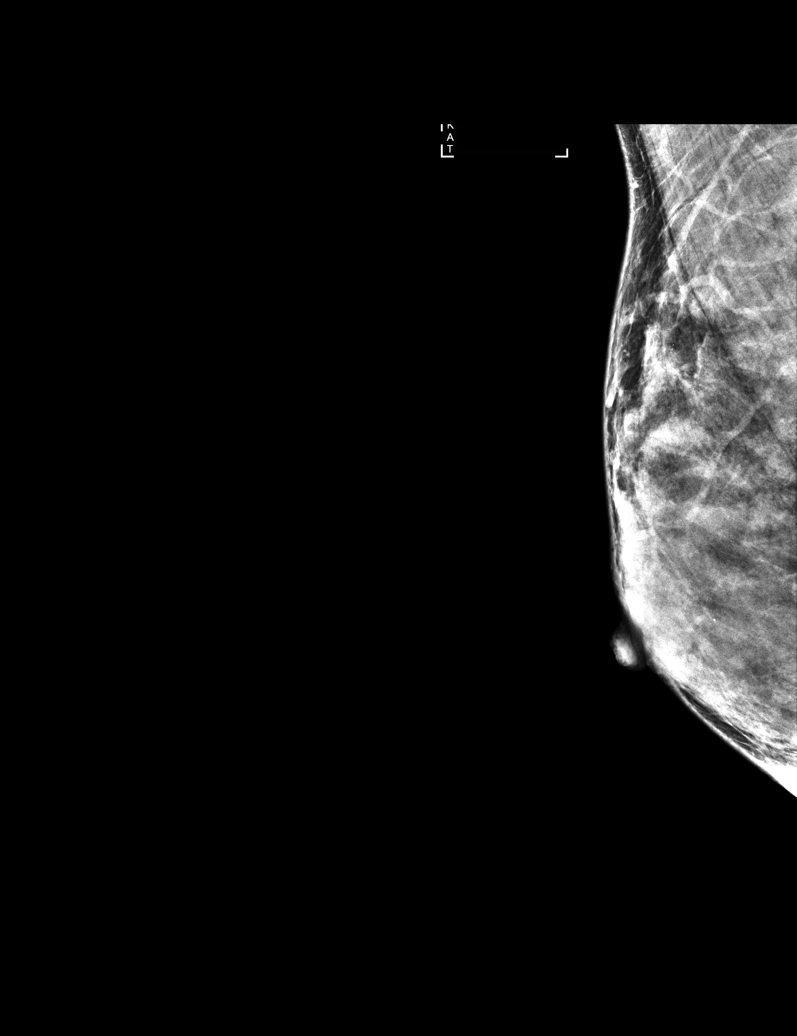

[L MLO]
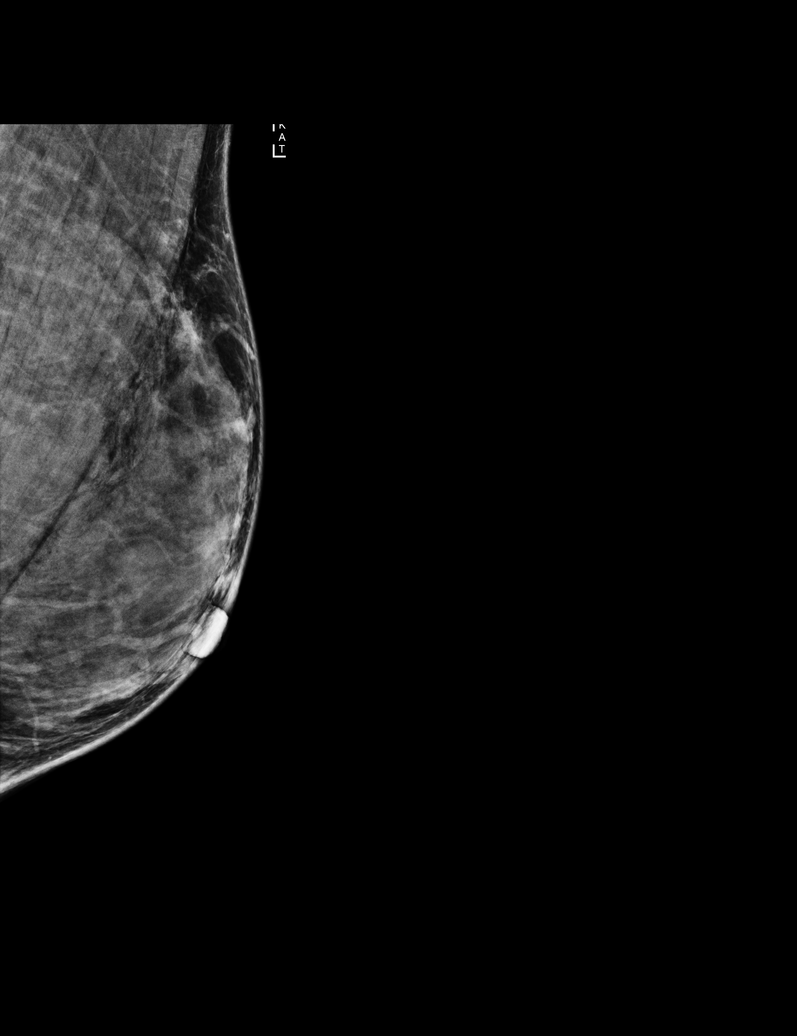

[R MLO (2 of 2)]
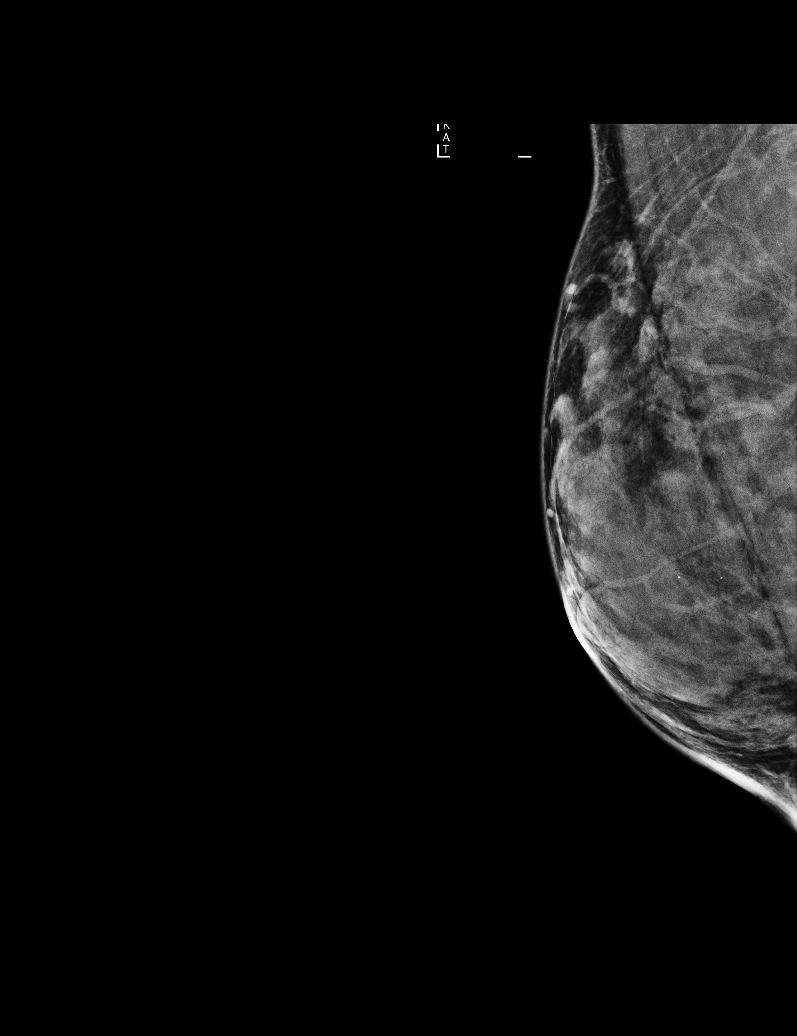

[R CC]
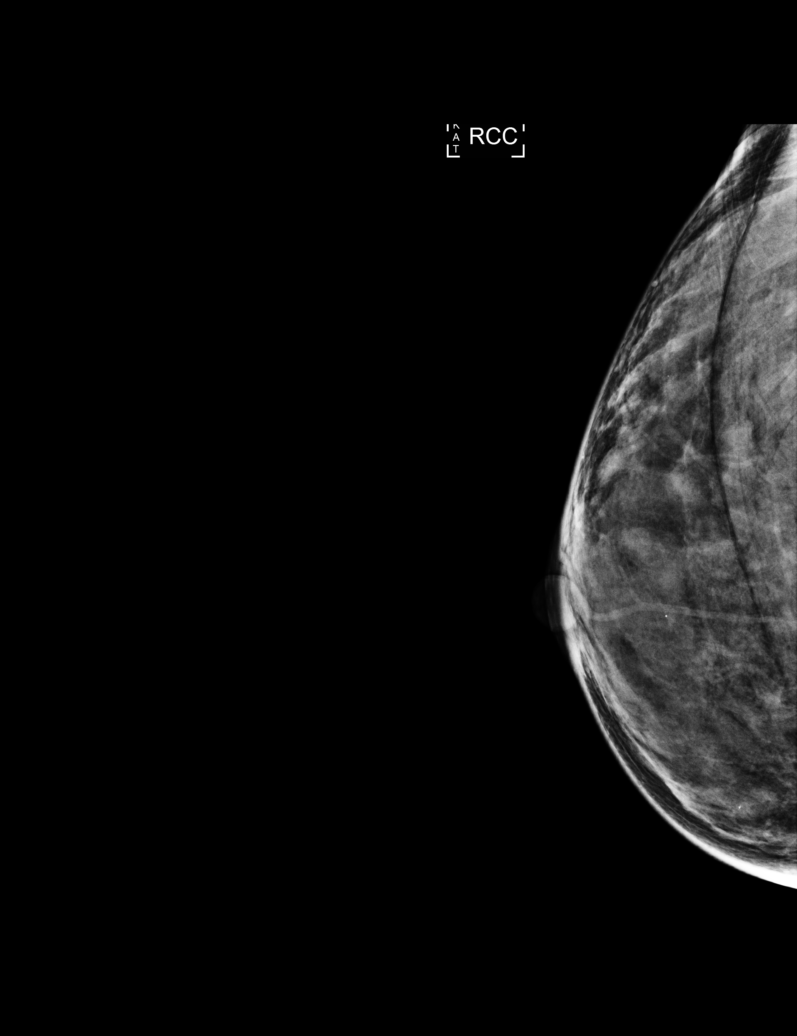

[L CC]
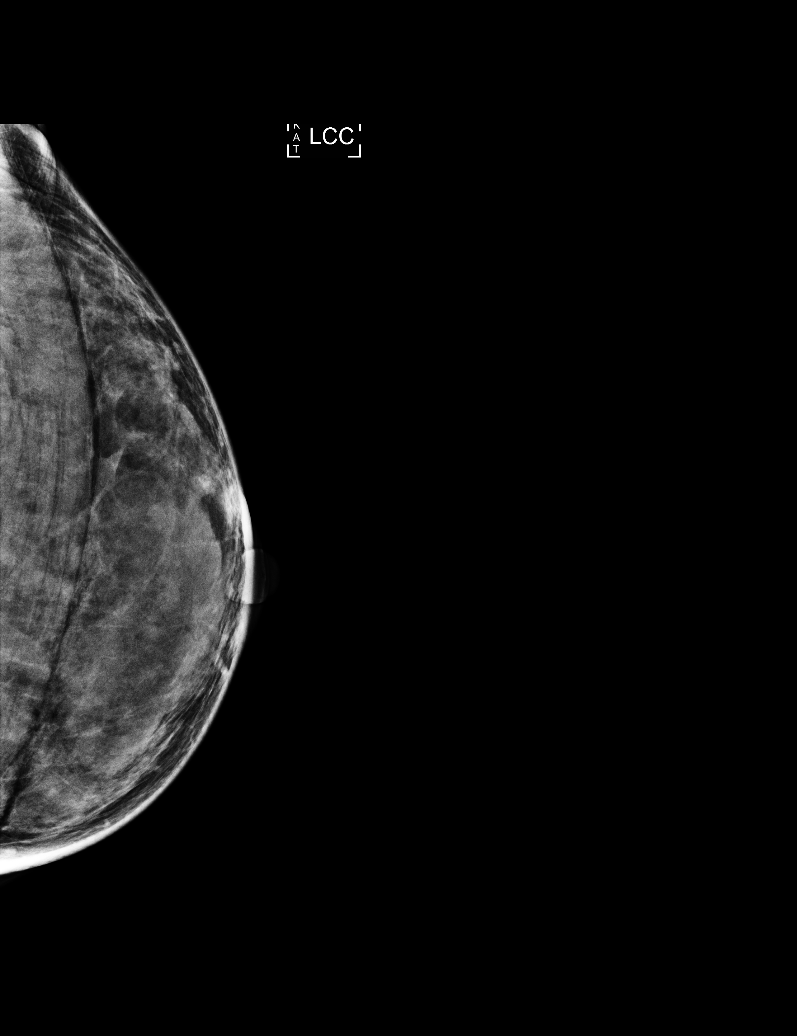

[5 of 5 positions shown; findings below may reference images not displayed]

ACR Breast Density Category c: The breast tissue is heterogeneously
dense, which may obscure small masses
FINDINGS: There are no findings suspicious for malignancy. The images were
evaluated with computer-aided detection.
IMPRESSION: No mammographic evidence of malignancy. A result letter of this
screening mammogram will be mailed directly to the patient.

RECOMMENDATION:
Screening mammogram in one year. (Code:69-V-IU6)

BI-RADS CATEGORY  1: Negative.

## 2021-06-07 MED ORDER — LEVOTHYROXINE SODIUM 100 MCG PO TABS: 100.0000 ug | ORAL_TABLET | Freq: Every day | ORAL | 0 refills | Status: DC

## 2021-06-07 NOTE — Telephone Encounter (Signed)
Rx sent 

## 2021-06-07 NOTE — Telephone Encounter (Signed)
Patient refill request.  levothyroxine (SYNTHROID) 100 MCG tablet [[id number]]   [facility] [address]

## 2021-06-11 ENCOUNTER — Other Ambulatory Visit: Payer: Self-pay

## 2021-06-21 ENCOUNTER — Ambulatory Visit: Payer: BC Managed Care – PPO | Admitting: Family Medicine

## 2021-06-21 ENCOUNTER — Encounter: Payer: Self-pay | Admitting: Family Medicine

## 2021-07-01 ENCOUNTER — Ambulatory Visit (INDEPENDENT_AMBULATORY_CARE_PROVIDER_SITE_OTHER): Payer: BC Managed Care – PPO | Admitting: Family Medicine

## 2021-07-01 ENCOUNTER — Other Ambulatory Visit: Payer: Self-pay

## 2021-07-01 ENCOUNTER — Encounter: Payer: Self-pay | Admitting: Family Medicine

## 2021-07-01 VITALS — BP 131/74 | HR 73 | Temp 98.8°F | Ht 67.0 in | Wt 146.0 lb

## 2021-07-01 DIAGNOSIS — E890 Postprocedural hypothyroidism: Secondary | ICD-10-CM

## 2021-07-01 DIAGNOSIS — Z23 Encounter for immunization: Secondary | ICD-10-CM | POA: Diagnosis not present

## 2021-07-01 NOTE — Progress Notes (Signed)
Patient ID: Claire Haynes, female  DOB: February 05, 1981, 40 y.o.   MRN: [id number] Patient Care Team    Relationship Specialty Notifications Start End  Natalia Leatherwood, DO PCP - General Family Medicine  09/23/17   Specialists, Dermatology  Dermatology  09/29/18   Gynecology, [facility] Obstetrics And  Obstetrics and Gynecology  09/29/18     Chief Complaint  Patient presents with   Hypothyroidism    F/u    Subjective: Claire Haynes is a 40 y.o.  female present for follow up  Hypothyroidism s/p ablation S/p ablation 2009. Has been stable until this past year she had required 2 increases in doses. Pt reports compliance on synthroid 100 mcg QD. Pt reports compliance  with medication daily on empty stomach.  Labs are due.    Depression screen [facility] 2/9 07/01/2021 11/12/2020 03/08/2018 09/23/2017  Decreased Interest 0 0 0 0  Down, Depressed, Hopeless 0 0 0 0  PHQ - 2 Score 0 0 0 0   No flowsheet data found.     No flowsheet data found.   Immunization History  Administered Date(s) Administered   Influenza Split 06/01/2012   Influenza Whole 07/20/2009   Influenza, Quadrivalent, Recombinant, Inj, Pf 06/30/2020   Influenza, Seasonal, Injecte, Preservative Fre 06/03/2019   Influenza,inj,Quad PF,6+ Mos 07/01/2021   Moderna SARS-COV2 Booster Vaccination 02/18/2021   Moderna Sars-Covid-2 Vaccination 01/14/2020, 02/11/2020   Td 10/13/1995, 01/30/2010   Tdap 01/30/2010    No results found.  Past Medical History:  Diagnosis Date   Acute bronchitis 12/04/2012   Anemia    iron   Chicken pox    as a child   COVID-19 04/2021   Hypertension    improved and was able to remove meds after dietary changes   Hypertension    Hyperthyroidism    s/p ablation-now with hypothyroidism   Left ventricular hypertrophy 03/22/2012   Shingles    No Known Allergies Past Surgical History:  Procedure Laterality Date   THYROID SURGERY  2009   thyroid ablation   Family History  Problem Relation Age of  Onset   Hypertension Mother    Hyperlipidemia Mother    Hyperthyroidism Mother    Hyperlipidemia Maternal Grandmother    Hypertension Maternal Grandmother    Arthritis Maternal Grandmother    Stroke Maternal Grandmother    Diabetes Maternal Grandmother 90       type 2   Hyperthyroidism Maternal Grandmother    Breast cancer Maternal Grandmother    Heart disease Maternal Grandmother    Lung cancer Maternal Grandfather    Social History   Socioeconomic History   Marital status: Single    Spouse name: Not on file   Number of children: Not on file   Years of education: Not on file   Highest education level: Not on file  Occupational History   Occupation: RN  Tobacco Use   Smoking status: Never   Smokeless tobacco: Never  Vaping Use   Vaping Use: Never used  Substance and Sexual Activity   Alcohol use: No   Drug use: No   Sexual activity: Never  Other Topics Concern   Not on file  Social History Narrative   Single. College educated. RN.    Exercises routinely.    Drinks caffeine.    Smoke alarm in the home.    Wears seatbelt.    Feels safe in her relationships.    Social Determinants of Health   Financial Resource Strain: Not on  file  Food Insecurity: Not on file  Transportation Needs: Not on file  Physical Activity: Not on file  Stress: Not on file  Social Connections: Not on file  Intimate Partner Violence: Not on file   Allergies as of 07/01/2021   No Known Allergies      Medication List        Accurate as of July 01, 2021  3:12 PM. If you have any questions, ask your nurse or doctor.          clindamycin 1 % gel Commonly known as: CLINDAGEL APPLY THIN LAYER TOPICALLY TO AFFECTED AREA TWICE DAILY   doxycycline 100 MG EC tablet Commonly known as: DORYX Take 1 tablet (100 mg total) by mouth 2 (two) times daily.   levothyroxine 100 MCG tablet Commonly known as: SYNTHROID Take 1 tablet (100 mcg total) by mouth daily.        All past  medical history, surgical history, allergies, family history, immunizations andmedications were updated in the EMR today and reviewed under the history and medication portions of their EMR.    No results found for this or any previous visit (from the past 2160 hour(s)).  ROS: 14 pt review of systems performed and negative (unless mentioned in an HPI)  Objective: BP 131/74   Pulse 73   Temp 98.8 F (37.1 C) (Oral)   Ht 5\' 7"  (1.702 m)   Wt 146 lb (66.2 kg)   SpO2 100%   BMI 22.87 kg/m  Gen: Afebrile. No acute distress.  HENT: AT. [place].  Neck/lymp/endocrine: Supple,no lymphadenopathy, no thyromegaly CV: RRR  Chest: CTAB, no wheeze or crackles Neuro: Normal gait. PERLA. EOMi. Alert. Oriented x3 Psych: Normal affect, dress and demeanor. Normal speech. Normal thought content and judgment.   Assessment/plan: Claire Haynes is a 40 y.o. female present for  Hyperthyroidism s/p ablation Abnormal last 2 labs> 2 increases of dose rpt labs today Continue levothyroxine 100 mcg a day.  TSH, T3, T4 f collected today Refills will be provided after results received.   Influenza vaccine administered today.   No follow-ups on file. Orders Placed This Encounter  Procedures   Flu Vaccine QUAD 6+ mos PF IM (Fluarix Quad PF)   T4, free   T3, free   TSH   No orders of the defined types were placed in this encounter.    Note is dictated utilizing voice recognition software. Although note has been proof read prior to signing, occasional typographical errors still can be missed. If any questions arise, please do not hesitate to call for verification.  Electronically signed by: 43, DO [facility] [place]

## 2021-07-01 NOTE — Patient Instructions (Signed)
Great to see you today.  I have refilled the medication(s) we provide.   If labs were collected, we will inform you of lab results once received either by echart message or telephone call.   - echart message- for normal results that have been seen by the patient already.   - telephone call: abnormal results or if patient has not viewed results in their echart.  

## 2021-07-02 ENCOUNTER — Other Ambulatory Visit: Payer: Self-pay | Admitting: Family Medicine

## 2021-07-02 LAB — TSH: TSH: 4.1 mIU/L

## 2021-07-02 LAB — T4, FREE: Free T4: 1.1 ng/dL (ref 0.8–1.8)

## 2021-07-02 LAB — T3, FREE: T3, Free: 2.9 pg/mL (ref 2.3–4.2)

## 2021-07-02 MED ORDER — LEVOTHYROXINE SODIUM 100 MCG PO TABS: 100.0000 ug | ORAL_TABLET | Freq: Every day | ORAL | 3 refills | Status: DC

## 2021-09-26 ENCOUNTER — Encounter: Payer: BC Managed Care – PPO | Admitting: Family Medicine

## 2022-03-10 ENCOUNTER — Encounter: Payer: Self-pay | Admitting: Family Medicine

## 2022-03-10 ENCOUNTER — Ambulatory Visit (INDEPENDENT_AMBULATORY_CARE_PROVIDER_SITE_OTHER): Payer: Self-pay | Admitting: Family Medicine

## 2022-03-10 VITALS — BP 127/88 | HR 74 | Temp 98.8°F | Ht 67.0 in | Wt 147.8 lb

## 2022-03-10 DIAGNOSIS — E890 Postprocedural hypothyroidism: Secondary | ICD-10-CM

## 2022-03-10 LAB — T3, FREE: T3, Free: 2.4 pg/mL (ref 2.3–4.2)

## 2022-03-10 LAB — T4, FREE: Free T4: 1.17 ng/dL (ref 0.60–1.60)

## 2022-03-10 LAB — TSH: TSH: 2.5 u[IU]/mL (ref 0.35–5.50)

## 2022-03-10 NOTE — Progress Notes (Signed)
Patient ID: Claire Haynes, female  DOB: May 03, 1981, 41 y.o.   MRN: [id number] Patient Care Team    Relationship Specialty Notifications Start End  Natalia Leatherwood, DO PCP - General Family Medicine  09/23/17   Specialists, Dermatology  Dermatology  09/29/18   Gynecology, [facility] Obstetric[facility] And  Obstetrics and Gynecology  09/29/18     Chief Complaint  Patient presents with   Thyroid Problem    States she is having sx     Subjective: Claire Haynes is a 41 y.o.  female present for follow up  Hypothyroidism s/p ablation S/p ablation 2009. Has been stable until this past year she had required 2 increases in doses. Pt reports compliance on synthroid 100 mcg QD. Pt reports compliance with medication daily on empty stomach.  She has noticed elevated HR (low 100s). When working out she has noticed 140-150s.. Noticed mild right chest discomfort. Sx since early June. She has noticed mild cough since covid. She has concerns her thryoid may be abnormal again since she is having symptoms. She does admit it may be stress related   Situational stress/concerns: She is having increase in stress, does not want medication at this time.        07/01/2021    2:49 PM 11/12/2020    1:47 PM 03/08/2018   11:08 AM 09/23/2017    9:41 AM  Depression screen PHQ 2/9  Decreased Interest 0 0 0 0  Down, Depressed, Hopeless 0 0 0 0  PHQ - 2 Score 0 0 0 0       No data to display                 No data to display           Immunization History  Administered Date(s) Administered   Influenza Split 06/01/2012   Influenza Whole 07/20/2009   Influenza, Quadrivalent, Recombinant, Inj, Pf 06/30/2020   Influenza, Seasonal, Injecte, Preservative Fre 06/03/2019   Influenza,inj,Quad PF,6+ Mos 07/01/2021   Moderna SARS-COV2 Booster Vaccination 02/18/2021   Moderna Sars-Covid-2 Vaccination 01/14/2020, 02/11/2020   Td 10/13/1995, 01/30/2010   Tdap 01/30/2010    No results found.  Past Medical History:   Diagnosis Date   Acute bronchitis 12/04/2012   Anemia    iron   Chicken pox    as a child   COVID-19 04/2021   Hypertension    improved and was able to remove meds after dietary changes   Hypertension    Hyperthyroidism    s/p ablation-now with hypothyroidism   Left ventricular hypertrophy 03/22/2012   Shingles    No Known Allergies Past Surgical History:  Procedure Laterality Date   THYROID SURGERY  2009   thyroid ablation   Family History  Problem Relation Age of Onset   Hypertension Mother    Hyperlipidemia Mother    Hyperthyroidism Mother    Hyperlipidemia Maternal Grandmother    Hypertension Maternal Grandmother    Arthritis Maternal Grandmother    Stroke Maternal Grandmother    Diabetes Maternal Grandmother 42       type 2   Hyperthyroidism Maternal Grandmother    Breast cancer Maternal Grandmother    Heart disease Maternal Grandmother    Lung cancer Maternal Grandfather    Social History   Socioeconomic History   Marital status: Single    Spouse name: Not on file   Number of children: Not on file   Years of education: Not on file  Highest education level: Not on file  Occupational History   Occupation: RN  Tobacco Use   Smoking status: Never   Smokeless tobacco: Never  Vaping Use   Vaping Use: Never used  Substance and Sexual Activity   Alcohol use: No   Drug use: No   Sexual activity: Never  Other Topics Concern   Not on file  Social History Narrative   Single. College educated. RN.    Exercises routinely.    Drinks caffeine.    Smoke alarm in the home.    Wears seatbelt.    Feels safe in her relationships.    Social Determinants of Health   Financial Resource Strain: Not on file  Food Insecurity: Not on file  Transportation Needs: Not on file  Physical Activity: Not on file  Stress: Not on file  Social Connections: Not on file  Intimate Partner Violence: Not on file   Allergies as of 03/10/2022   No Known Allergies       Medication List        Accurate as of March 10, 2022 11:59 PM. If you have any questions, ask your nurse or doctor.          STOP taking these medications    clindamycin 1 % gel Commonly known as: CLINDAGEL Stopped by: Felix Pacini, DO   doxycycline 100 MG EC tablet Commonly known as: DORYX Stopped by: Felix Pacini, DO       TAKE these medications    levothyroxine 100 MCG tablet Commonly known as: SYNTHROID Take 1 tablet (100 mcg total) by mouth daily.        All past medical history, surgical history, allergies, family history, immunizations andmedications were updated in the EMR today and reviewed under the history and medication portions of their EMR.    Recent Results (from the past 2160 hour(s))  T4, free     Status: None   Collection Time: 03/10/22  8:28 AM  Result Value Ref Range   Free T4 1.17 0.60 - 1.60 ng/dL    Comment: Specimens from patients who are undergoing biotin therapy and /or ingesting biotin supplements may contain high levels of biotin.  The higher biotin concentration in these specimens interferes with this Free T4 assay.  Specimens that contain high levels  of biotin may cause false high results for this Free T4 assay.  Please interpret results in light of the total clinical presentation of the patient.    TSH     Status: None   Collection Time: 03/10/22  8:28 AM  Result Value Ref Range   TSH 2.50 0.35 - 5.50 uIU/mL  T3, free     Status: None   Collection Time: 03/10/22  8:28 AM  Result Value Ref Range   T3, Free 2.4 2.3 - 4.2 pg/mL    ROS: 14 pt review of systems performed and negative (unless mentioned in an HPI)  Objective: BP 127/88   Pulse 74   Temp 98.8 F (37.1 C)   Ht 5\' 7"  (1.702 m)   Wt 147 lb 12.8 oz (67 kg)   SpO2 100%   BMI 23.15 kg/m  Physical Exam Vitals and nursing note reviewed.  Constitutional:      General: She is not in acute distress.    Appearance: Normal appearance. She is not ill-appearing,  toxic-appearing or diaphoretic.  HENT:     Head: Normocephalic and atraumatic.  Eyes:     General: No scleral icterus.  Right eye: No discharge.        Left eye: No discharge.     Extraocular Movements: Extraocular movements intact.     Conjunctiva/sclera: Conjunctivae normal.     Pupils: Pupils are equal, round, and reactive to light.  Neck:     Comments: No thyromegaly.  Cardiovascular:     Rate and Rhythm: Normal rate and regular rhythm.  Musculoskeletal:     Cervical back: Neck supple. No tenderness.     Right lower leg: No edema.     Left lower leg: No edema.  Lymphadenopathy:     Cervical: No cervical adenopathy.  Skin:    General: Skin is warm and dry.     Coloration: Skin is not jaundiced or pale.     Findings: No erythema or rash.  Neurological:     Mental Status: She is alert and oriented to person, place, and time. Mental status is at baseline.     Motor: No weakness.     Gait: Gait normal.  Psychiatric:        Mood and Affect: Mood normal.        Behavior: Behavior normal.        Thought Content: Thought content normal.        Judgment: Judgment normal.     Assessment/plan: Claire Haynes is a 41 y.o. female present for  Hypothyroidism s/p ablation Continue  levothyroxine 100 mcg a day, for now. If result abnormal will adjust.  TSH, t3 free, T4 f collected today   Situational stress/concerns: Declines medication at this time.  She has a therapist.   No follow-ups on file. Orders Placed This Encounter  Procedures   T4, free   TSH   T3, free   No orders of the defined types were placed in this encounter.     Note is dictated utilizing voice recognition software. Although note has been proof read prior to signing, occasional typographical errors still can be missed. If any questions arise, please do not hesitate to call for verification.  Electronically signed by: Felix Pacini, DO [facility] [place]

## 2022-07-23 ENCOUNTER — Telehealth: Payer: Self-pay | Admitting: Family Medicine

## 2022-07-23 MED ORDER — LEVOTHYROXINE SODIUM 100 MCG PO TABS: 100.0000 ug | ORAL_TABLET | Freq: Every day | ORAL | 1 refills | Status: DC

## 2022-07-23 NOTE — Telephone Encounter (Signed)
Rx sent 

## 2022-07-23 NOTE — Telephone Encounter (Signed)
Pt is needing refill on levothyroxine. She reports that she is completely out.

## 2022-09-09 ENCOUNTER — Ambulatory Visit (INDEPENDENT_AMBULATORY_CARE_PROVIDER_SITE_OTHER): Payer: No Typology Code available for payment source | Admitting: Family Medicine

## 2022-09-09 ENCOUNTER — Encounter: Payer: Self-pay | Admitting: Family Medicine

## 2022-09-09 VITALS — BP 132/75 | HR 101 | Temp 98.7°F | Ht 67.0 in | Wt 141.0 lb

## 2022-09-09 DIAGNOSIS — R000 Tachycardia, unspecified: Secondary | ICD-10-CM

## 2022-09-09 DIAGNOSIS — R8290 Unspecified abnormal findings in urine: Secondary | ICD-10-CM

## 2022-09-09 DIAGNOSIS — R0602 Shortness of breath: Secondary | ICD-10-CM

## 2022-09-09 DIAGNOSIS — I10 Essential (primary) hypertension: Secondary | ICD-10-CM

## 2022-09-09 LAB — IBC + FERRITIN
Ferritin: 6.1 ng/mL — ABNORMAL LOW (ref 10.0–291.0)
Iron: 60 ug/dL (ref 42–145)
Saturation Ratios: 15.8 % — ABNORMAL LOW (ref 20.0–50.0)
TIBC: 380.8 ug/dL (ref 250.0–450.0)
Transferrin: 272 mg/dL (ref 212.0–360.0)

## 2022-09-09 LAB — COMPREHENSIVE METABOLIC PANEL
ALT: 15 U/L (ref 0–35)
AST: 17 U/L (ref 0–37)
Albumin: 4.5 g/dL (ref 3.5–5.2)
Alkaline Phosphatase: 36 U/L — ABNORMAL LOW (ref 39–117)
BUN: 9 mg/dL (ref 6–23)
CO2: 27 mEq/L (ref 19–32)
Calcium: 9 mg/dL (ref 8.4–10.5)
Chloride: 103 mEq/L (ref 96–112)
Creatinine, Ser: 0.97 mg/dL (ref 0.40–1.20)
GFR: 71.32 mL/min (ref >60.00)
Glucose, Bld: 84 mg/dL (ref 70–99)
Potassium: 3.7 mEq/L (ref 3.5–5.1)
Sodium: 139 mEq/L (ref 135–145)
Total Bilirubin: 0.5 mg/dL (ref 0.2–1.2)
Total Protein: 7.3 g/dL (ref 6.0–8.3)

## 2022-09-09 LAB — CBC WITH DIFFERENTIAL/PLATELET
Basophils Absolute: 0 10*3/uL (ref 0.0–0.1)
Basophils Relative: 0.8 % (ref 0.0–3.0)
Eosinophils Absolute: 0.2 10*3/uL (ref 0.0–0.7)
Eosinophils Relative: 4.9 % (ref 0.0–5.0)
HCT: 35.4 % — ABNORMAL LOW (ref 36.0–46.0)
Hemoglobin: 12 g/dL (ref 12.0–15.0)
Lymphocytes Relative: 25.5 % (ref 12.0–46.0)
Lymphs Abs: 1 10*3/uL (ref 0.7–4.0)
MCHC: 33.8 g/dL (ref 30.0–36.0)
MCV: 88.8 fl (ref 78.0–100.0)
Monocytes Absolute: 0.4 10*3/uL (ref 0.1–1.0)
Monocytes Relative: 8.8 % (ref 3.0–12.0)
Neutro Abs: 2.4 10*3/uL (ref 1.4–7.7)
Neutrophils Relative %: 60 % (ref 43.0–77.0)
Platelets: 208 10*3/uL (ref 150.0–400.0)
RBC: 3.99 Mil/uL (ref 3.87–5.11)
RDW: 14.5 % (ref 11.5–15.5)
WBC: 4 10*3/uL (ref 4.0–10.5)

## 2022-09-09 LAB — TSH: TSH: 2.69 u[IU]/mL (ref 0.35–5.50)

## 2022-09-09 MED ORDER — ATENOLOL 25 MG PO TABS: 25.0000 mg | ORAL_TABLET | Freq: Every day | ORAL | 1 refills | Status: DC

## 2022-09-09 NOTE — Progress Notes (Unsigned)
Claire Haynes , 1980-09-19, 42 y.o., female MRN: [id number] Patient Care Team    Relationship Specialty Notifications Start End  Ma Hillock, DO PCP - General Family Medicine  09/23/17   Specialists, Dermatology  Dermatology  09/29/18   Gynecology, [facility] Obstetrics And  Obstetrics and Gynecology  09/29/18     Chief Complaint  Patient presents with   Hypertension    Pt reports elevated BP x 1 mo; SBP 150-130 DBP 90-60; pulse WINL     Subjective: Pt presents for an OV with complaints of elevation of blood pressures.  She used to be on a beta-blocker for her elevated blood pressures and pulse.  She has not needed in quite some time.  She reports she had leftover atenolol restarted that a few days ago.  She is feeling more short of breath with exertion and has concerns it is cardiac related. She has had a history of lower iron in the past and abnormal TSH.    09/09/2022   10:43 AM 07/01/2021    2:49 PM 11/12/2020    1:47 PM 03/08/2018   11:08 AM 09/23/2017    9:41 AM  Depression screen PHQ 2/9  Decreased Interest 0 0 0 0 0  Down, Depressed, Hopeless 0 0 0 0 0  PHQ - 2 Score 0 0 0 0 0    No Known Allergies Social History   Social History Narrative   Single. College educated. RN.    Exercises routinely.    Drinks caffeine.    Smoke alarm in the home.    Wears seatbelt.    Feels safe in her relationships.    Past Medical History:  Diagnosis Date   Acute bronchitis 12/04/2012   Anemia    iron   Chicken pox    as a child   COVID-19 04/2021   Hypertension    improved and was able to remove meds after dietary changes   Hypertension    Hyperthyroidism    s/p ablation-now with hypothyroidism   Left ventricular hypertrophy 03/22/2012   Shingles    Past Surgical History:  Procedure Laterality Date   THYROID SURGERY  2009   thyroid ablation   Family History  Problem Relation Age of Onset   Hypertension Mother    Hyperlipidemia Mother    Hyperthyroidism Mother     Hyperlipidemia Maternal Grandmother    Hypertension Maternal Grandmother    Arthritis Maternal Grandmother    Stroke Maternal Grandmother    Diabetes Maternal Grandmother 74       type 2   Hyperthyroidism Maternal Grandmother    Breast cancer Maternal Grandmother    Heart disease Maternal Grandmother    Lung cancer Maternal Grandfather    Allergies as of 09/09/2022   No Known Allergies      Medication List        Accurate as of September 09, 2022 10:54 AM. If you have any questions, ask your nurse or doctor.          levothyroxine 100 MCG tablet Commonly known as: SYNTHROID Take 1 tablet (100 mcg total) by mouth daily.        All past medical history, surgical history, allergies, family history, immunizations andmedications were updated in the EMR today and reviewed under the history and medication portions of their EMR.     ROS Negative, with the exception of above mentioned in HPI  Objective:  BP 132/75   Pulse (!) 101  Temp 98.7 F (37.1 C) (Oral)   Ht 5\' 7"  (1.702 m)   Wt 141 lb (64 kg)   SpO2 100%   BMI 22.08 kg/m  Body mass index is 22.08 kg/m. Physical Exam Vitals and nursing note reviewed.  Constitutional:      General: She is not in acute distress.    Appearance: Normal appearance. She is normal weight. She is not ill-appearing or toxic-appearing.  Eyes:     Extraocular Movements: Extraocular movements intact.     Conjunctiva/sclera: Conjunctivae normal.     Pupils: Pupils are equal, round, and reactive to light.  Cardiovascular:     Rate and Rhythm: Normal rate and regular rhythm.     Heart sounds: No murmur heard. Pulmonary:     Effort: Pulmonary effort is normal. No respiratory distress.     Breath sounds: Normal breath sounds. No wheezing, rhonchi or rales.  Chest:     Chest wall: No tenderness.  Musculoskeletal:     Right lower leg: No edema.     Left lower leg: No edema.  Neurological:     Mental Status: She is alert and oriented  to person, place, and time. Mental status is at baseline.  Psychiatric:        Mood and Affect: Mood normal.        Behavior: Behavior normal.        Thought Content: Thought content normal.        Judgment: Judgment normal.     No results found. No results found. No results found for this or any previous visit (from the past 24 hour(s)).  Assessment/Plan: Claire Haynes is a 42 y.o. female present for OV for  Hypertension-tachycardia-dyspnea with exertion Restart atenolol 25 mg daily.  She had already restarted this, with an old prescription. - CBC w/Diff - IBC + Ferritin - TSH - Comp Met (CMET) Placed a referral to cardiology to further evaluate her dyspnea with tachycardia.  Foul smelling urine During manage appointment patient also complained of foul-smelling urine.  No dysuria.  Possible mild vaginal discharge. - Urinalysis w microscopic + reflex cultur Since symptoms are mild, we elected to wait to start treatment until results are received.   Reviewed expectations re: course of current medical issues. Discussed self-management of symptoms. Outlined signs and symptoms indicating need for more acute intervention. Patient verbalized understanding and all questions were answered. Patient received an After-Visit Summary.    No orders of the defined types were placed in this encounter.  No orders of the defined types were placed in this encounter.  Referral Orders  No referral(s) requested today     Note is dictated utilizing voice recognition software. Although note has been proof read prior to signing, occasional typographical errors still can be missed. If any questions arise, please do not hesitate to call for verification.   electronically signed by:  57, DO  [facility] [place]

## 2022-09-10 ENCOUNTER — Telehealth: Payer: Self-pay | Admitting: Family Medicine

## 2022-09-10 DIAGNOSIS — R0602 Shortness of breath: Secondary | ICD-10-CM

## 2022-09-10 DIAGNOSIS — I10 Essential (primary) hypertension: Secondary | ICD-10-CM

## 2022-09-10 DIAGNOSIS — R000 Tachycardia, unspecified: Secondary | ICD-10-CM | POA: Insufficient documentation

## 2022-09-10 MED ORDER — FLUCONAZOLE 150 MG PO TABS: 150.0000 mg | ORAL_TABLET | Freq: Once | ORAL | 0 refills | Status: AC

## 2022-09-10 NOTE — Telephone Encounter (Signed)
Spoke with pt regarding labs and instructions.   

## 2022-09-10 NOTE — Telephone Encounter (Signed)
Please call patient: Her thyroid levels are normal. Her liver, kidney function and electrolytes are normal. Her blood cell counts are normal and her hemoglobin is 12. Her iron levels in the blood are within normal limits at 60.  Her ferritin/iron stores are low at 6.1.  This may play a small role in her shortness of breath.  She could consider returning to her iron supplement 1-2 times a week.  Urinalysis showed evidence of a yeast infection.  It currently is being sent for culture to see if there is any bacterial infection. I have called in a Diflucan tab for her to take for her yeast infection.  Diflucan x 1 p.o.  I also went ahead and put in the referral to cardiology for her.  Since I am not convinced the low ferritin is the sole cause of her tachycardia, we would want to be on the safe side and have her evaluated by cardiology.   We will call her with the urine culture results once received.

## 2022-09-11 ENCOUNTER — Telehealth: Payer: Self-pay | Admitting: Family Medicine

## 2022-09-11 ENCOUNTER — Encounter: Payer: Self-pay | Admitting: Family Medicine

## 2022-09-11 LAB — URINALYSIS W MICROSCOPIC + REFLEX CULTURE
Bilirubin Urine: NEGATIVE
Glucose, UA: NEGATIVE
Hgb urine dipstick: NEGATIVE
Hyaline Cast: NONE SEEN /LPF
Ketones, ur: NEGATIVE
Nitrites, Initial: NEGATIVE
RBC / HPF: NONE SEEN /HPF (ref 0–2)
Specific Gravity, Urine: 1.019 (ref 1.001–1.035)
pH: 7 (ref 5.0–8.0)

## 2022-09-11 LAB — URINE CULTURE
MICRO NUMBER:: 14411794
Result:: NO GROWTH
SPECIMEN QUALITY:: ADEQUATE

## 2022-09-11 LAB — CULTURE INDICATED

## 2022-09-11 NOTE — Telephone Encounter (Signed)
Will call pt at earliest convenience, currently in clinic

## 2022-09-11 NOTE — Telephone Encounter (Signed)
Spoke with pt regarding labs and instructions.   

## 2022-09-11 NOTE — Telephone Encounter (Signed)
Patient calling for Dr. Raoul Pitch regarding test results.  Patient has sent 2 mychart messages today also.  Please call patient at [phone]

## 2022-09-11 NOTE — Telephone Encounter (Signed)
Emiyah T Faron  [facility] (supporting Renee A Kuneff, DO)28 minutes ago (10:35 AM)   TD I would love to be started on an antibiotic if this is agreeable to you

## 2022-09-11 NOTE — Telephone Encounter (Signed)
Pt states she was returning a call to [place] and  would like a call back from her.

## 2022-09-25 ENCOUNTER — Ambulatory Visit: Payer: No Typology Code available for payment source | Admitting: Internal Medicine

## 2022-10-16 ENCOUNTER — Encounter: Payer: Self-pay | Admitting: Internal Medicine

## 2022-10-16 ENCOUNTER — Ambulatory Visit: Payer: No Typology Code available for payment source | Attending: Internal Medicine | Admitting: Internal Medicine

## 2022-10-16 VITALS — BP 136/90 | HR 70 | Ht 65.0 in | Wt 146.8 lb

## 2022-10-16 DIAGNOSIS — R0609 Other forms of dyspnea: Secondary | ICD-10-CM | POA: Diagnosis not present

## 2022-10-16 DIAGNOSIS — I10 Essential (primary) hypertension: Secondary | ICD-10-CM | POA: Diagnosis not present

## 2022-10-16 DIAGNOSIS — E890 Postprocedural hypothyroidism: Secondary | ICD-10-CM

## 2022-10-16 DIAGNOSIS — R000 Tachycardia, unspecified: Secondary | ICD-10-CM | POA: Diagnosis not present

## 2022-10-16 NOTE — Patient Instructions (Signed)
Medication Instructions:  No Changes In Medications at this time.  *If you need a refill on your cardiac medications before your next appointment, please call your pharmacy*  Lab Work: None Ordered At This Time.  If you have labs (blood work) drawn today and your tests are completely normal, you will receive your results only by: [place] (if you have MyChart) OR A paper copy in the mail If you have any lab test that is abnormal or we need to change your treatment, we will call you to review the results.  Testing/Procedures: Your physician has requested that you have an exercise tolerance test. For further information please visit [email or web address]. Please also follow instruction sheet, as given.  Your physician has requested that you have an exercise tolerance test.  Please also follow instruction sheet, as given. This will take place at [address], [address] Do not drink or eat foods with caffeine for 24 hours before the test. (Chocolate, coffee, tea, or energy drinks) If you use an inhaler, bring it with you to the test. Do not smoke for 4 hours before the test. Wear comfortable shoes and clothing.  Your physician has requested that you have an echocardiogram. Echocardiography is a painless test that uses sound waves to create images of your heart. It provides your doctor with information about the size and shape of your heart and how well your heart's chambers and valves are working. You may receive an ultrasound enhancing agent through an IV if needed to better visualize your heart during the echo.This procedure takes approximately one hour. There are no restrictions for this procedure. This will take place at the [address].   Follow-Up: At [facility], you and your health needs are our priority.  As part of our continuing mission to provide you with exceptional heart care, we have created designated Provider Care Teams.  These Care Teams include  your primary Cardiologist (physician) and Advanced Practice Providers (APPs -  Physician Assistants and Nurse Practitioners) who all work together to provide you with the care you need, when you need it.  Your next appointment:   MARCH 20th at [place]  Provider:   Elouise Munroe, MD

## 2022-10-16 NOTE — Progress Notes (Signed)
Cardiology Office Note:    Date:  10/16/2022  ID:  SERENNA Haynes, DOB 1981-07-26, MRN [id number]  PCP:  Ma Hillock, DO  Cardiologist:  Elouise Munroe, MD  Electrophysiologist:  None   Referring MD: Ma Hillock, DO   Chief Complaint/Reason for Referral: DOE, HTN, PALPITATIONS/TACHYCARDIA  History of Present Illness:    Claire Haynes is a 42 y.o. female with a history of hypertension and hypothyroidism who presents for evaluation of dyspnea on exertion and tachycardia.  She was seen by her PCP 09/09/2022 regarding hypertension, tachycardia and shortness of breath on exertion. Referral to cardiology was made.   She has had a diagnosis of hypertension for several years and previously took atenolol.  She was able to make aggressive dietary changes including going plant-based and was able to come off of her antihypertensive therapy for period of time.  However over the last 3 months she has noticed that she is more short of breath with exertion, blood pressure has been elevated into the Q000111Q systolic, and her palpitations with tachycardia were worsening.  She resumed atenolol and has been feeling quite a bit better but continues to have dyspnea on exertion.  Since restarting atenolol, her tachycardia has resolved. She is currently taking atenolol 25 mg daily. She is concerned that her blood pressure is still elevated. In office today it was 136/90. She does have a history of hypertension in her family.   She is still having some issues with shortness of breath.  In addition, at night she will wake up with an cough. She denies any sour taste or burning when this is occurring. Discussed possibly taking Pepcid or Tums at night to avoid any heart burn associating coughing. She did have a COVID infection and has noticed some of the cough associated with that.  PCP had recommended zyrtec but she did not want to take it.  She is still having some issues with shortness of breath if she is  walking long distances, particularly if carrying something heavy. It has improved since restarting the atenolol, but it is still present. Especially when she is holding or carrying a bag she will have to stop after a 100 feet or so. She works out regularly and had not had this limitation before. It is not occurring every time but often. It will take her about 60 seconds to recover and then she will be able to go on. She is able to push through it to exercise. At times there is some chest discomfort associated with it but it is primarily shortness of breath. She does not experience any shortness of breath when she is lifting weights which she does for exercise.   Heart disease in family. Grandmother had a stroke. Paternal side of the family has heart disease but not aware of the specifics.   She denies any current palpitations or peripheral edema. No lightheadedness, headaches, syncope, orthopnea, or PND.     Past Medical History:  Diagnosis Date   Acute bronchitis 12/04/2012   Anemia    iron   Chicken pox    as a child   COVID-19 04/2021   Hypertension    improved and was able to remove meds after dietary changes   Hypertension    Hyperthyroidism    s/p ablation-now with hypothyroidism   Left ventricular hypertrophy 03/22/2012   Shingles     Past Surgical History:  Procedure Laterality Date   THYROID SURGERY  2009   thyroid ablation  Current Medications: Current Meds  Medication Sig   atenolol (TENORMIN) 25 MG tablet Take 1 tablet (25 mg total) by mouth daily.   levothyroxine (SYNTHROID) 100 MCG tablet Take 1 tablet (100 mcg total) by mouth daily.     Allergies:   Patient has no known allergies.   Social History   Tobacco Use   Smoking status: Never   Smokeless tobacco: Never  Vaping Use   Vaping Use: Never used  Substance Use Topics   Alcohol use: No   Drug use: No     Family History: The patient's family history includes Arthritis in her maternal grandmother;  Breast cancer in her maternal grandmother; Diabetes (age of onset: 55) in her maternal grandmother; Heart disease in her maternal grandmother; Hyperlipidemia in her maternal grandmother and mother; Hypertension in her maternal grandmother and mother; Hyperthyroidism in her maternal grandmother and mother; Lung cancer in her maternal grandfather; Stroke in her maternal grandmother.  ROS:   Please see the history of present illness.    (+) shortness of breathe (exertional) (+) chest pain (exertional) (+) coughing (at night) All other systems reviewed and are negative.  EKGs/Labs/Other Studies Reviewed:    The following studies were reviewed today:  No prior cardiovascular studies available.   EKG:  EKG is personally reviewed.  10/16/2022: Sinus Rhythm  Imaging studies that I have independently reviewed today: n/a  Recent Labs: 09/09/2022: ALT 15; BUN 9; Creatinine, Ser 0.97; Hemoglobin 12.0; Platelets 208.0; Potassium 3.7; Sodium 139; TSH 2.69  Recent Lipid Panel    Component Value Date/Time   CHOL 230 (H) 11/21/2020 1019   TRIG 40.0 11/21/2020 1019   HDL 88.00 11/21/2020 1019   CHOLHDL 3 11/21/2020 1019   VLDL 8.0 11/21/2020 1019   LDLCALC 134 (H) 11/21/2020 1019    Physical Exam:    VS:  BP (!) 136/90   Pulse 70   Ht 5' 5"$  (1.651 m)   Wt 146 lb 12.8 oz (66.6 kg)   BMI 24.43 kg/m     Wt Readings from Last 5 Encounters:  10/16/22 146 lb 12.8 oz (66.6 kg)  09/09/22 141 lb (64 kg)  03/10/22 147 lb 12.8 oz (67 kg)  07/01/21 146 lb (66.2 kg)  10/13/19 147 lb 2 oz (66.7 kg)    Constitutional: No acute distress Eyes: sclera non-icteric, normal conjunctiva and lids ENMT: normal dentition, moist mucous membranes Cardiovascular: regular rhythm, normal rate, no murmur. S1 and S2 normal. No jugular venous distention.  Respiratory: clear to auscultation bilaterally GI : normal bowel sounds, soft and nontender. No distention.   MSK: extremities warm, well perfused. No edema.   NEURO: grossly nonfocal exam, moves all extremities. PSYCH: alert and oriented x 3, normal mood and affect.   ASSESSMENT:    1. Dyspnea on exertion   2. Primary hypertension   3. Postablative hypothyroidism   4. Tachycardia    PLAN:    Dyspnea on exertion - Plan: EKG 12-Lead, ECHOCARDIOGRAM COMPLETE, Exercise Tolerance Test  Primary hypertension  Postablative hypothyroidism  Tachycardia  -Will order an exercise treadmill test given that the patient's symptoms are primarily with exertion and I am suspicious for exercise hypertension given her underlying diagnosis of hypertension.  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  Should be encouraged to exercise as long as she can. -Echocardiogram to rule out structural or congenital heart disease, and evaluate left ventricular myocardial thickness and diastolic function with a history of hypertension. -She will likely need an additional antihypertensive agent  but we have agreed in shared decision making to add this if needed after testing is complete.  Follow up: 4 weeks  Cherlynn Kaiser, MD, [facility]   Shared Decision Making/Informed Consent:   Shared Decision Making/Informed Consent The risks [chest pain, shortness of breath, cardiac arrhythmias, dizziness, blood pressure fluctuations, myocardial infarction, stroke/transient ischemic attack, and life-threatening complications (estimated to be 1 in 10,000)], benefits (risk stratification, diagnosing coronary artery disease, treatment guidance) and alternatives of an exercise tolerance test were discussed in detail with Ms. Sambrano and she agrees to proceed.   Medication Adjustments/Labs and Tests Ordered: Current medicines are reviewed at length with the patient today.  Concerns regarding medicines are outlined above.   Orders Placed This Encounter  Procedures   Exercise Tolerance Test   EKG 12-Lead   ECHOCARDIOGRAM COMPLETE    No orders of the defined  types were placed in this encounter.   Patient Instructions  Medication Instructions:  No Changes In Medications at this time.  *If you need a refill on your cardiac medications before your next appointment, please call your pharmacy*  Lab Work: None Ordered At This Time.  If you have labs (blood work) drawn today and your tests are completely normal, you will receive your results only by: [place] (if you have MyChart) OR A paper copy in the mail If you have any lab test that is abnormal or we need to change your treatment, we will call you to review the results.  Testing/Procedures: Your physician has requested that you have an exercise tolerance test. For further information please visit [email or web address]. Please also follow instruction sheet, as given.  Your physician has requested that you have an exercise tolerance test.  Please also follow instruction sheet, as given. This will take place at [address] Do not drink or eat foods with caffeine for 24 hours before the test. (Chocolate, coffee, tea, or energy drinks) If you use an inhaler, bring it with you to the test. Do not smoke for 4 hours before the test. Wear comfortable shoes and clothing.  Your physician has requested that you have an echocardiogram. Echocardiography is a painless test that uses sound waves to create images of your heart. It provides your doctor with information about the size and shape of your heart and how well your heart's chambers and valves are working. You may receive an ultrasound enhancing agent through an IV if needed to better visualize your heart during the echo.This procedure takes approximately one hour. There are no restrictions for this procedure. This will take place at the [address].   Follow-Up: At [facility], you and your health needs are our priority.  As part of our continuing mission to provide you with exceptional heart care, we have  created designated Provider Care Teams.  These Care Teams include your primary Cardiologist (physician) and Advanced Practice Providers (APPs -  Physician Assistants and Nurse Practitioners) who all work together to provide you with the care you need, when you need it.  Your next appointment:   MARCH 20th at [facility]  Provider:   Elouise Munroe, MD          Burnett Kanaris Ford,acting as a scribe for Elouise Munroe, MD.,have documented all relevant documentation on the behalf of Elouise Munroe, MD,as directed by  Elouise Munroe, MD while in the presence of Elouise Munroe, MD.   I, Elouise Munroe, MD,  have reviewed all documentation for this visit. The documentation on 10/16/22 for the exam, diagnosis, procedures, and orders are all accurate and complete.

## 2022-11-13 ENCOUNTER — Ambulatory Visit: Payer: No Typology Code available for payment source

## 2022-11-17 NOTE — Progress Notes (Incomplete)
Cardiology Office Note:    Date:  11/19/2022  ID:  Claire Haynes, DOB 05/08/1981, MRN [id number]  PCP:  Ma Hillock, DO  Cardiologist:  Elouise Munroe, MD  Electrophysiologist:  None   Referring MD: Ma Hillock, DO   Chief Complaint/Reason for Referral: DOE, HTN, PALPITATIONS/TACHYCARDIA  History of Present Illness:    Claire Haynes is a 42 y.o. female with a history of hypertension and hypothyroidism who presents for evaluation of dyspnea on exertion and tachycardia.  She was seen by her PCP 09/09/2022 regarding hypertension, tachycardia and shortness of breath on exertion. Referral to cardiology was made.   At her last appointment on 10/16/2022 she reported having had a diagnosis of hypertension for several years and previously took atenolol.  She was able to make aggressive dietary changes including going plant-based and was able to come off of her antihypertensive therapy for period of time.  However the 3 months prior to her visit she has noticed that she was more short of breath with exertion, blood pressure had been elevated into the Q000111Q systolic, and her palpitations with tachycardia were worsening.  She resumed atenolol and had been feeling quite a bit better but continued to have dyspnea on exertion.  Since restarting atenolol, her tachycardia had resolved. She was taking atenolol 25 mg daily. She was concerned her blood pressure was still elevated. During her last visit on 10/16/2022 it was 136/90. She has a history of hypertension in her family.   She was still having some issues with shortness of breath.  In addition, at night she would wake up with a cough. She denied any sour taste or burning when this was occurring. Discussed possibly taking Pepcid or Tums at night to avoid any heart burn associating coughing. She did have a COVID infection and had noticed some of the cough associated with that.  PCP had recommended zyrtec but she did not want to take it.  She was  still having some issues with shortness of breath if she was walking long distances, particularly if carrying something heavy. It has improved since restarting the atenolol, but it was still present. Especially when she was holding or carrying a bag she would have to stop after a 100 feet or so. She worked out regularly and had not had limitations before. It was not occurring every time but often. It would take her about 60 seconds to recover and then she would be able to go on. She was able to push through it to exercise. At times there was some chest discomfort associated with it but it was primarily shortness of breath. She does not experience any shortness of breath when she was lifting weights which she does for exercise.   Heart disease in family. Grandmother had a stroke. Paternal side of the family has heart disease but not aware of the specifics.   ---  Today,    *** denies any palpitations, chest pain, shortness of breath, or peripheral edema. No lightheadedness, headaches, syncope, orthopnea, or PND.  (+)     Past Medical History:  Diagnosis Date   Acute bronchitis 12/04/2012   Anemia    iron   Chicken pox    as a child   COVID-19 04/2021   Hypertension    improved and was able to remove meds after dietary changes   Hypertension    Hyperthyroidism    s/p ablation-now with hypothyroidism   Left ventricular hypertrophy 03/22/2012   Shingles  Past Surgical History:  Procedure Laterality Date   THYROID SURGERY  2009   thyroid ablation    Current Medications: No outpatient medications have been marked as taking for the 11/19/22 encounter (Appointment) with Elouise Munroe, MD.     Allergies:   Patient has no known allergies.   Social History   Tobacco Use   Smoking status: Never   Smokeless tobacco: Never  Vaping Use   Vaping Use: Never used  Substance Use Topics   Alcohol use: No   Drug use: No     Family History: The patient's family history  includes Arthritis in her maternal grandmother; Breast cancer in her maternal grandmother; Diabetes (age of onset: 68) in her maternal grandmother; Heart disease in her maternal grandmother; Hyperlipidemia in her maternal grandmother and mother; Hypertension in her maternal grandmother and mother; Hyperthyroidism in her maternal grandmother and mother; Lung cancer in her maternal grandfather; Stroke in her maternal grandmother.  ROS:   Please see the history of present illness.     All other systems reviewed and are negative.  EKGs/Labs/Other Studies Reviewed:    The following studies were reviewed today:  No prior cardiovascular studies available.   EKG:  EKG is personally reviewed.  11/19/2022: *** 10/16/2022: Sinus Rhythm  Imaging studies that I have independently reviewed today: n/a  Recent Labs: 09/09/2022: ALT 15; BUN 9; Creatinine, Ser 0.97; Hemoglobin 12.0; Platelets 208.0; Potassium 3.7; Sodium 139; TSH 2.69  Recent Lipid Panel    Component Value Date/Time   CHOL 230 (H) 11/21/2020 1019   TRIG 40.0 11/21/2020 1019   HDL 88.00 11/21/2020 1019   CHOLHDL 3 11/21/2020 1019   VLDL 8.0 11/21/2020 1019   LDLCALC 134 (H) 11/21/2020 1019    Physical Exam:    VS:  There were no vitals taken for this visit.    Wt Readings from Last 5 Encounters:  10/16/22 146 lb 12.8 oz (66.6 kg)  09/09/22 141 lb (64 kg)  03/10/22 147 lb 12.8 oz (67 kg)  07/01/21 146 lb (66.2 kg)  10/13/19 147 lb 2 oz (66.7 kg)    Constitutional: No acute distress Eyes: sclera non-icteric, normal conjunctiva and lids ENMT: normal dentition, moist mucous membranes Cardiovascular: regular rhythm, normal rate, no murmur. S1 and S2 normal. No jugular venous distention.  Respiratory: clear to auscultation bilaterally GI : normal bowel sounds, soft and nontender. No distention.   MSK: extremities warm, well perfused. No edema.  NEURO: grossly nonfocal exam, moves all extremities. PSYCH: alert and oriented x 3,  normal mood and affect.   ASSESSMENT:    No diagnosis found.  PLAN:    No diagnosis found.  -Will order an exercise treadmill test given that the patient's symptoms are primarily with exertion and I am suspicious for exercise hypertension given her underlying diagnosis of hypertension.  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  Should be encouraged to exercise as long as she can. -Echocardiogram to rule out structural or congenital heart disease, and evaluate left ventricular myocardial thickness and diastolic function with a history of hypertension. -She will likely need an additional antihypertensive agent but we have agreed in shared decision making to add this if needed after testing is complete.  *** Plan: - Follow up in *** -    Cherlynn Kaiser, MD, [facility]   Shared Decision Making/Informed Consent:   Shared Decision Making/Informed Consent The risks [chest pain, shortness of breath, cardiac arrhythmias, dizziness, blood pressure fluctuations, myocardial  infarction, stroke/transient ischemic attack, and life-threatening complications (estimated to be 1 in 10,000)], benefits (risk stratification, diagnosing coronary artery disease, treatment guidance) and alternatives of an exercise tolerance test were discussed in detail with Ms. Mcleary and she agrees to proceed.   Medication Adjustments/Labs and Tests Ordered: Current medicines are reviewed at length with the patient today.  Concerns regarding medicines are outlined above.   No orders of the defined types were placed in this encounter.  No orders of the defined types were placed in this encounter.  There are no Patient Instructions on file for this visit.      I,Rachel Rivera,acting as a scribe for Elouise Munroe, MD.,have documented all relevant documentation on the behalf of Elouise Munroe, MD,as directed by  Elouise Munroe, MD while in the presence of Elouise Munroe,  MD.  ***

## 2022-11-18 ENCOUNTER — Ambulatory Visit: Payer: No Typology Code available for payment source | Attending: Internal Medicine

## 2022-11-18 ENCOUNTER — Ambulatory Visit (HOSPITAL_COMMUNITY): Payer: No Typology Code available for payment source

## 2022-11-18 DIAGNOSIS — R0609 Other forms of dyspnea: Secondary | ICD-10-CM | POA: Diagnosis not present

## 2022-11-18 LAB — EXERCISE TOLERANCE TEST
Angina Index: 0
Duke Treadmill Score: 1
Estimated workload: 12.7
Exercise duration (min): 10 min
Exercise duration (sec): 39 s
MPHR: 176 {beats}/min
Peak HR: 153 {beats}/min
Percent HR: 86 %
RPE: 15
Rest HR: 74 {beats}/min
ST Depression (mm): 2 mm

## 2022-11-19 ENCOUNTER — Ambulatory Visit: Payer: No Typology Code available for payment source | Admitting: Internal Medicine

## 2022-12-12 ENCOUNTER — Ambulatory Visit (HOSPITAL_COMMUNITY): Payer: No Typology Code available for payment source | Attending: Cardiovascular Disease

## 2022-12-12 DIAGNOSIS — R0609 Other forms of dyspnea: Secondary | ICD-10-CM

## 2022-12-12 LAB — ECHOCARDIOGRAM COMPLETE
Area-P 1/2: 4.06 cm2
S' Lateral: 2.5 cm

## 2022-12-19 ENCOUNTER — Ambulatory Visit: Payer: No Typology Code available for payment source | Admitting: Nurse Practitioner

## 2022-12-19 NOTE — Progress Notes (Deleted)
Office Visit    Patient Name: Claire Haynes Date of Encounter: 12/19/2022  Primary Care Provider:  Natalia Leatherwood, DO Primary Cardiologist:  Parke Poisson, MD  Chief Complaint    42 year old female with a history of dyspnea on exertion, hypertension, and hypothyroidism who presents for follow-up related to dyspnea on exertion.   Past Medical History    Past Medical History:  Diagnosis Date   Acute bronchitis 12/04/2012   Anemia    iron   Chicken pox    as a child   COVID-19 04/2021   Hypertension    improved and was able to remove meds after dietary changes   Hypertension    Hyperthyroidism    s/p ablation-now with hypothyroidism   Left ventricular hypertrophy 03/22/2012   Shingles    Past Surgical History:  Procedure Laterality Date   THYROID SURGERY  2009   thyroid ablation    Allergies  No Known Allergies   Labs/Other Studies Reviewed    The following studies were reviewed today: ETT 2019/12/24:    2.0 mm of up sloping ST depression (II, III, aVL, V4, V5 and V6) was noted.   Prior study not available for comparison.   Negative adequate stress test without evidence of ischemia at given workload. 2 mm upsloping ST depression at peak exercise. Hypertensive response to exercise.   Echo 12/2022:  IMPRESSIONS     1. Left ventricular ejection fraction, by estimation, is 70 to 75%. Left  ventricular ejection fraction by 3D volume is 71 %. The left ventricle has  hyperdynamic function. The left ventricle has no regional wall motion  abnormalities. Left ventricular  diastolic parameters were normal. The average left ventricular global  longitudinal strain is -18.2 %. The global longitudinal strain is normal.   2. Right ventricular systolic function is normal. The right ventricular  size is normal. There is normal pulmonary artery systolic pressure. The  estimated right ventricular systolic pressure is 24.9 mmHg.   3. Left atrial size was mildly dilated.    4. The mitral valve is normal in structure. Trivial mitral valve  regurgitation. No evidence of mitral stenosis.   5. The aortic valve is tricuspid. Aortic valve regurgitation is not  visualized. Aortic valve sclerosis is present, with no evidence of aortic  valve stenosis.   6. The aortic root at the SOV measures 3.7cm and when indexed for BSA and  gender this is normal range.   7. The inferior vena cava is normal in size with greater than 50%  respiratory variability, suggesting right atrial pressure of 3 mmHg.    Recent Labs: 09/09/2022: ALT 15; BUN 9; Creatinine, Ser 0.97; Hemoglobin 12.0; Platelets 208.0; Potassium 3.7; Sodium 139; TSH 2.69  Recent Lipid Panel    Component Value Date/Time   CHOL 230 (H) 11/21/2020 1019   TRIG 40.0 11/21/2020 1019   HDL 88.00 11/21/2020 1019   CHOLHDL 3 11/21/2020 1019   VLDL 8.0 11/21/2020 1019   LDLCALC 134 (H) 11/21/2020 1019    History of Present Illness    42 year old female with the above past medical history including dyspnea on exertion, hypertension, and hypothyroidism.  She was referred to cardiology by her PCP in the setting of hypertension, tachycardia, and dyspnea on exertion.  She was last seen in the office on 10/16/2022 and was stable from a cardiac standpoint though she did note ongoing dyspnea on exertion.  ETT was negative for ischemia but did show hypertensive response to exercise.  Echocardiogram  showed EF 70 to 75%, hyperdynamic LV function, no RWMA, normal RV systolic function, no significant valvular abnormalities.  Patient presents today for follow-up.  Since her last visit  Dyspnea on exertion: Hypertension: Hypothyroidism: Disposition:  Home Medications    Current Outpatient Medications  Medication Sig Dispense Refill   atenolol (TENORMIN) 25 MG tablet Take 1 tablet (25 mg total) by mouth daily. 90 tablet 1   levothyroxine (SYNTHROID) 100 MCG tablet Take 1 tablet (100 mcg total) by mouth daily. 90 tablet 1    No current facility-administered medications for this visit.     Review of Systems    ***.  All other systems reviewed and are otherwise negative except as noted above.    Physical Exam    VS:  There were no vitals taken for this visit. , BMI There is no height or weight on file to calculate BMI.     GEN: Well nourished, well developed, in no acute distress. HEENT: normal. Neck: Supple, no JVD, carotid bruits, or masses. Cardiac: RRR, no murmurs, rubs, or gallops. No clubbing, cyanosis, edema.  Radials/DP/PT 2+ and equal bilaterally.  Respiratory:  Respirations regular and unlabored, clear to auscultation bilaterally. GI: Soft, nontender, nondistended, BS + x 4. MS: no deformity or atrophy. Skin: warm and dry, no rash. Neuro:  Strength and sensation are intact. Psych: Normal affect.  Accessory Clinical Findings    ECG personally reviewed by me today - *** - no acute changes.   Lab Results  Component Value Date   WBC 4.0 09/09/2022   HGB 12.0 09/09/2022   HCT 35.4 (L) 09/09/2022   MCV 88.8 09/09/2022   PLT 208.0 09/09/2022   Lab Results  Component Value Date   CREATININE 0.97 09/09/2022   BUN 9 09/09/2022   NA 139 09/09/2022   K 3.7 09/09/2022   CL 103 09/09/2022   CO2 27 09/09/2022   Lab Results  Component Value Date   ALT 15 09/09/2022   AST 17 09/09/2022   ALKPHOS 36 (L) 09/09/2022   BILITOT 0.5 09/09/2022   Lab Results  Component Value Date   CHOL 230 (H) 11/21/2020   HDL 88.00 11/21/2020   LDLCALC 134 (H) 11/21/2020   TRIG 40.0 11/21/2020   CHOLHDL 3 11/21/2020    No results found for: "HGBA1C"  Assessment & Plan    1.  ***  No BP recorded.  {Refresh Note OR Click here to enter BP  :1}***   Joylene Grapes, NP 12/19/2022, 6:20 AM

## 2023-01-20 ENCOUNTER — Other Ambulatory Visit: Payer: Self-pay | Admitting: Family Medicine

## 2023-03-02 ENCOUNTER — Other Ambulatory Visit: Payer: Self-pay | Admitting: Family Medicine

## 2023-03-08 ENCOUNTER — Other Ambulatory Visit: Payer: Self-pay | Admitting: Family Medicine

## 2023-03-09 NOTE — Telephone Encounter (Signed)
Pt scheduled for appt on 7/10

## 2023-03-11 ENCOUNTER — Other Ambulatory Visit: Payer: Self-pay | Admitting: Family Medicine

## 2023-03-11 ENCOUNTER — Ambulatory Visit (INDEPENDENT_AMBULATORY_CARE_PROVIDER_SITE_OTHER): Payer: No Typology Code available for payment source | Admitting: Family Medicine

## 2023-03-11 ENCOUNTER — Encounter: Payer: Self-pay | Admitting: Family Medicine

## 2023-03-11 VITALS — BP 125/83 | HR 66 | Temp 98.2°F | Wt 140.2 lb

## 2023-03-11 DIAGNOSIS — I10 Essential (primary) hypertension: Secondary | ICD-10-CM

## 2023-03-11 DIAGNOSIS — R000 Tachycardia, unspecified: Secondary | ICD-10-CM | POA: Diagnosis not present

## 2023-03-11 DIAGNOSIS — E890 Postprocedural hypothyroidism: Secondary | ICD-10-CM | POA: Diagnosis not present

## 2023-03-11 LAB — T4, FREE: Free T4: 0.94 ng/dL (ref 0.60–1.60)

## 2023-03-11 LAB — TSH: TSH: 5.15 u[IU]/mL (ref 0.35–5.50)

## 2023-03-11 MED ORDER — ATENOLOL 25 MG PO TABS: 25.0000 mg | ORAL_TABLET | Freq: Every day | ORAL | 1 refills | Status: DC

## 2023-03-11 MED ORDER — LEVOTHYROXINE SODIUM 112 MCG PO TABS: 112.0000 ug | ORAL_TABLET | Freq: Every day | ORAL | 3 refills | Status: DC

## 2023-03-11 NOTE — Patient Instructions (Addendum)
Return in about 5 months (around 08/13/2023) for Routine chronic condition follow-up.  Start iron polysaccharide 150-200 mg once daily. Good plant sources of iron are lentils, chickpeas, beans, tofu, cashew nuts, pumpkin seeds, kale, dried apricots and figs, raisins, quinoa and fortified breakfast cereal.      Great to see you today.  I have refilled the medication(s) we provide.   If labs were collected, we will inform you of lab results once received either by echart message or telephone call.   - echart message- for normal results that have been seen by the patient already.   - telephone call: abnormal results or if patient has not viewed results in their echart.

## 2023-03-11 NOTE — Progress Notes (Signed)
Claire Haynes , [birth year], 42 y.o., female MRN: [id number] Patient Care Team    Relationship Specialty Notifications Start End  Natalia Leatherwood, DO PCP - General Family Medicine  09/23/17   Parke Poisson, MD PCP - Cardiology Cardiology  10/16/22   Specialists, Dermatology  Dermatology  09/29/18   Gynecology, [facility] Obstetrics And  Obstetrics and Gynecology  09/29/18     Chief Complaint  Patient presents with   Hypothyroidism    Has notice a little extra fatigue and dizziness at times     Subjective: Pt presents for an OV with complaints of elevation of blood pressures.  She used to be on a beta-blocker for her elevated blood pressures and pulse.  She has not needed in quite some time.  She reports she had leftover atenolol restarted that a few days ago.  She is feeling more short of breath with exertion and has concerns it is cardiac related. She has had a history of lower iron in the past and abnormal TSH.  09/2022:  iron 60, 15.8 sat, ferritin 6 Cbc and tsh wnl    09/09/2022   10:43 AM 07/01/2021    2:49 PM 11/12/2020    1:47 PM 03/08/2018   11:08 AM 09/23/2017    9:41 AM  Depression screen PHQ 2/9  Decreased Interest 0 0 0 0 0  Down, Depressed, Hopeless 0 0 0 0 0  PHQ - 2 Score 0 0 0 0 0    No Known Allergies Social History   Social History Narrative   Single. College educated. RN.    Exercises routinely.    Drinks caffeine.    Smoke alarm in the home.    Wears seatbelt.    Feels safe in her relationships.    Past Medical History:  Diagnosis Date   Acute bronchitis 12/04/2012   Anemia    iron   Chicken pox    as a child   COVID-19 04/2021   Hypertension    improved and was able to remove meds after dietary changes   Hypertension    Hyperthyroidism    s/p ablation-now with hypothyroidism   Left ventricular hypertrophy 03/22/2012   Shingles    Past Surgical History:  Procedure Laterality Date   THYROID SURGERY  2009   thyroid ablation   Family  History  Problem Relation Age of Onset   Hypertension Mother    Hyperlipidemia Mother    Hyperthyroidism Mother    Hyperlipidemia Maternal Grandmother    Hypertension Maternal Grandmother    Arthritis Maternal Grandmother    Stroke Maternal Grandmother    Diabetes Maternal Grandmother 67       type 2   Hyperthyroidism Maternal Grandmother    Breast cancer Maternal Grandmother    Heart disease Maternal Grandmother    Lung cancer Maternal Grandfather    Allergies as of 03/11/2023   No Known Allergies      Medication List        Accurate as of March 11, 2023 10:30 AM. If you have any questions, ask your nurse or doctor.          atenolol 25 MG tablet Commonly known as: Tenormin Take 1 tablet (25 mg total) by mouth daily.   levothyroxine 100 MCG tablet Commonly known as: SYNTHROID Take 1 tablet by mouth once daily        All past medical history, surgical history, allergies, family history, immunizations andmedications were updated in the  EMR today and reviewed under the history and medication portions of their EMR.     ROS Negative, with the exception of above mentioned in HPI  Objective:  BP 125/83   Pulse 66   Temp 98.2 F (36.8 C)   Wt 140 lb 3.2 oz (63.6 kg)   LMP 02/25/2023   SpO2 98%   BMI 23.33 kg/m  Body mass index is 23.33 kg/m. Physical Exam Vitals and nursing note reviewed.  Constitutional:      General: She is not in acute distress.    Appearance: Normal appearance. She is normal weight. She is not ill-appearing or toxic-appearing.  Eyes:     Extraocular Movements: Extraocular movements intact.     Conjunctiva/sclera: Conjunctivae normal.     Pupils: Pupils are equal, round, and reactive to light.  Cardiovascular:     Rate and Rhythm: Normal rate and regular rhythm.     Heart sounds: No murmur heard. Pulmonary:     Effort: Pulmonary effort is normal. No respiratory distress.     Breath sounds: Normal breath sounds. No wheezing, rhonchi  or rales.  Chest:     Chest wall: No tenderness.  Musculoskeletal:     Right lower leg: No edema.     Left lower leg: No edema.  Neurological:     Mental Status: She is alert and oriented to person, place, and time. Mental status is at baseline.  Psychiatric:        Mood and Affect: Mood normal.        Behavior: Behavior normal.        Thought Content: Thought content normal.        Judgment: Judgment normal.    No results found. No results found. No results found for this or any previous visit (from the past 24 hour(s)).  Assessment/Plan: Claire Haynes is a 42 y.o. female present for OV for  Hypertension-tachycardia-dyspnea with exertion Stable Continue  atenolol 25 mg daily.   Placed a referral to cardiology to further evaluate her dyspnea with tachycardia, last visit  Postablative hypothyroidism Fatigue present Tsh, t4 free collected Continue levo 100 mcg for now. refills will be provided in appropriate dose based on lab result today  Return in about 5 months (around 08/13/2023) for Routine chronic condition follow-up.    Reviewed expectations re: course of current medical issues. Discussed self-management of symptoms. Outlined signs and symptoms indicating need for more acute intervention. Patient verbalized understanding and all questions were answered. Patient received an After-Visit Summary.    Orders Placed This Encounter  Procedures   TSH   T4, free   Meds ordered this encounter  Medications   atenolol (TENORMIN) 25 MG tablet    Sig: Take 1 tablet (25 mg total) by mouth daily.    Dispense:  90 tablet    Refill:  1   Referral Orders  No referral(s) requested today     Note is dictated utilizing voice recognition software. Although note has been proof read prior to signing, occasional typographical errors still can be missed. If any questions arise, please do not hesitate to call for verification.   electronically signed by:  Felix Pacini, DO   [facility] - [place]

## 2023-08-11 ENCOUNTER — Ambulatory Visit (INDEPENDENT_AMBULATORY_CARE_PROVIDER_SITE_OTHER): Payer: No Typology Code available for payment source | Admitting: Family Medicine

## 2023-08-11 DIAGNOSIS — E890 Postprocedural hypothyroidism: Secondary | ICD-10-CM

## 2023-08-11 DIAGNOSIS — I10 Essential (primary) hypertension: Secondary | ICD-10-CM

## 2023-08-11 DIAGNOSIS — Z91199 Patient's noncompliance with other medical treatment and regimen due to unspecified reason: Secondary | ICD-10-CM

## 2023-08-11 NOTE — Progress Notes (Signed)
  Same day cancel 

## 2023-08-11 NOTE — Patient Instructions (Signed)

## 2023-08-14 ENCOUNTER — Encounter: Payer: Self-pay | Admitting: Family Medicine

## 2023-08-14 ENCOUNTER — Ambulatory Visit (INDEPENDENT_AMBULATORY_CARE_PROVIDER_SITE_OTHER): Payer: No Typology Code available for payment source | Admitting: Family Medicine

## 2023-08-14 DIAGNOSIS — Z91199 Patient's noncompliance with other medical treatment and regimen due to unspecified reason: Secondary | ICD-10-CM

## 2023-08-14 NOTE — Progress Notes (Unsigned)
     Same day cancel 2nd in 1 month.

## 2023-08-18 ENCOUNTER — Encounter: Payer: Self-pay | Admitting: Family Medicine

## 2023-08-19 ENCOUNTER — Ambulatory Visit (INDEPENDENT_AMBULATORY_CARE_PROVIDER_SITE_OTHER): Payer: No Typology Code available for payment source | Admitting: Family Medicine

## 2023-08-19 ENCOUNTER — Encounter: Payer: Self-pay | Admitting: Family Medicine

## 2023-08-19 VITALS — BP 107/70 | HR 65 | Temp 98.4°F | Resp 18 | Ht 65.0 in | Wt 147.0 lb

## 2023-08-19 DIAGNOSIS — Z7689 Persons encountering health services in other specified circumstances: Secondary | ICD-10-CM

## 2023-08-19 DIAGNOSIS — E890 Postprocedural hypothyroidism: Secondary | ICD-10-CM | POA: Diagnosis not present

## 2023-08-19 DIAGNOSIS — I10 Essential (primary) hypertension: Secondary | ICD-10-CM

## 2023-08-19 NOTE — Progress Notes (Signed)
New Patient Office Visit  Subjective    Patient ID: Claire Haynes, female    DOB: 26-Dec-1980  Age: 42 y.o. MRN: [id number]  CC:  Chief Complaint  Patient presents with   Establish Care    Patient is here to establish care with a new PCP, and discuss Thyroid levels, She states that she has a previous  diagnoses with Thyroid  disease    HPI TANGY BENZ presents to establish care. Pt is new to me  Pt has hx of HTN and is taking Atenolol 25 mg daily. She was taking Bystolic before this. Tolerating medicine well. Blood pressure at goal today. She has hx of Graves dz diagnosed in 2009. She had an ablation and has been on Levothyroxine since then. She feels like she is fatigued. She reports the last time she had her levels checked was in July and she had to get her medicine dosage adjusted from 75 mcg to 112 mcg daily. She hasn't had it checked since and would like this done today.  She gets her pap smears at [facility].     Outpatient Encounter Medications as of 08/19/2023  Medication Sig   atenolol (TENORMIN) 25 MG tablet Take 1 tablet (25 mg total) by mouth daily.   levothyroxine (SYNTHROID) 112 MCG tablet Take 1 tablet (112 mcg total) by mouth daily.   No facility-administered encounter medications on file as of 08/19/2023.    Past Medical History:  Diagnosis Date   Acute bronchitis 12/04/2012   Anemia    iron   Chicken pox    as a child   COVID-19 04/2021   Hypertension    improved and was able to remove meds after dietary changes   Hypertension    Hyperthyroidism    s/p ablation-now with hypothyroidism   Left ventricular hypertrophy 03/22/2012   Shingles     Past Surgical History:  Procedure Laterality Date   THYROID SURGERY  2009   thyroid ablation    Family History  Problem Relation Age of Onset   Hypertension Mother    Hyperlipidemia Mother    Hyperthyroidism Mother    Alcohol abuse Father    Hyperlipidemia Maternal Grandmother     Hypertension Maternal Grandmother    Arthritis Maternal Grandmother    Stroke Maternal Grandmother    Diabetes Maternal Grandmother 71       type 2   Hyperthyroidism Maternal Grandmother    Breast cancer Maternal Grandmother    Heart disease Maternal Grandmother    Lung cancer Maternal Grandfather     Social History   Socioeconomic History   Marital status: Single    Spouse name: Not on file   Number of children: Not on file   Years of education: Not on file   Highest education level: Bachelor's degree (e.g., BA, AB, BS)  Occupational History   Occupation: RN  Tobacco Use   Smoking status: Never   Smokeless tobacco: Never  Vaping Use   Vaping status: Never Used  Substance and Sexual Activity   Alcohol use: No   Drug use: No   Sexual activity: Not Currently    Birth control/protection: Abstinence  Other Topics Concern   Not on file  Social History Narrative   Single. College educated. RN.    Exercises routinely.    Drinks caffeine.    Smoke alarm in the home.    Wears seatbelt.    Feels safe in her relationships.    Social Drivers of  Health   Financial Resource Strain: Low Risk  (08/18/2023)   Overall Financial Resource Strain (CARDIA)    Difficulty of Paying Living Expenses: Not hard at all  Food Insecurity: No Food Insecurity (08/18/2023)   Hunger Vital Sign    Worried About Running Out of Food in the Last Year: Never true    Ran Out of Food in the Last Year: Never true  Transportation Needs: No Transportation Needs (08/18/2023)   PRAPARE - Administrator, Civil Service (Medical): No    Lack of Transportation (Non-Medical): No  Physical Activity: Sufficiently Active (08/18/2023)   Exercise Vital Sign    Days of Exercise per Week: 4 days    Minutes of Exercise per Session: 60 min  Stress: No Stress Concern Present (08/18/2023)   [employer] of Occupational Health - Occupational Stress Questionnaire    Feeling of Stress : Not at all  Social  Connections: Unknown (08/18/2023)   Social Connection and Isolation Panel [NHANES]    Frequency of Communication with Friends and Family: Twice a week    Frequency of Social Gatherings with Friends and Family: Patient declined    Attends Religious Services: More than 4 times per year    Active Member of [employer] or Organizations: Yes    Attends Banker Meetings: More than 4 times per year    Marital Status: Never married  Intimate Partner Violence: Unknown (07/14/2022)   Received from [employer], [facility]   HITS    Physically Hurt: Not on file    Insult or Talk Down To: Not on file    Threaten Physical Harm: Not on file    Scream or Curse: Not on file    Review of Systems  Constitutional:  Positive for malaise/fatigue.  All other systems reviewed and are negative.      Objective    There were no vitals taken for this visit.  Physical Exam Vitals and nursing note reviewed.  Constitutional:      Appearance: Normal appearance. She is normal weight.  HENT:     Head: Normocephalic and atraumatic.     Right Ear: External ear normal.     Left Ear: External ear normal.     Nose: Nose normal.     Mouth/Throat:     Mouth: Mucous membranes are moist.     Pharynx: Oropharynx is clear.  Eyes:     Conjunctiva/sclera: Conjunctivae normal.     Pupils: Pupils are equal, round, and reactive to light.  Cardiovascular:     Rate and Rhythm: Normal rate and regular rhythm.     Pulses: Normal pulses.     Heart sounds: Normal heart sounds.  Pulmonary:     Effort: Pulmonary effort is normal.     Breath sounds: Normal breath sounds.  Skin:    General: Skin is warm.     Capillary Refill: Capillary refill takes less than 2 seconds.  Neurological:     General: No focal deficit present.     Mental Status: She is alert and oriented to person, place, and time. Mental status is at baseline.  Psychiatric:        Mood and Affect: Mood normal.        Behavior: Behavior normal.         Thought Content: Thought content normal.        Judgment: Judgment normal.   Last thyroid functions Lab Results  Component Value Date   TSH 5.15 03/11/2023  Assessment & Plan:   Problem List Items Addressed This Visit   None Encounter to establish care with new doctor  Postablative hypothyroidism -     TSH -     T4, free  Primary hypertension   To check thyroid function today. Adjust dose if indicated Blood pressure at goal. Continue Atenolol 25 mg daily  No follow-ups on file.   Suzan Slick, MD

## 2023-08-19 NOTE — Patient Instructions (Addendum)
Maxie Better MD [facility] OB/GYN [address] [place] [place], [place] [address] [phone]

## 2023-08-20 LAB — T4, FREE: Free T4: 1.35 ng/dL (ref 0.82–1.77)

## 2023-08-20 LAB — TSH: TSH: 2.5 u[IU]/mL (ref 0.450–4.500)

## 2023-08-24 ENCOUNTER — Telehealth: Payer: Self-pay | Admitting: Family Medicine

## 2023-08-24 NOTE — Telephone Encounter (Signed)
Patient dropped off document  [employer] , to be filled out by provider. Patient requested to send it back via Call Patient to pick up within 7-days. Document is located in providers tray at front office.Please advise at [facility] [phone]

## 2023-08-31 ENCOUNTER — Ambulatory Visit: Payer: No Typology Code available for payment source | Admitting: Family Medicine

## 2023-08-31 NOTE — Telephone Encounter (Signed)
Letter written and attached to jury duty form. Pt may pick up. Will be in completed forms box at nursing station

## 2023-09-30 ENCOUNTER — Telehealth: Payer: Self-pay | Admitting: Family Medicine

## 2023-09-30 NOTE — Telephone Encounter (Unsigned)
Copied from CRM [id number]. Topic: Clinical - Medication Question >> Sep 30, 2023 10:06 AM Denese Killings wrote: Reason for CRM: Santina Evans with [facility] has questions regarding Patient's levothyroxine (SYNTHROID) 112 MCG tablet. She states that they are changing manufacturer and wanted to make sure doctor is ok with the change. Nothing will change except manufacturer.

## 2023-10-01 NOTE — Telephone Encounter (Signed)
As long as pt is good with the change. I'm fine with it.

## 2023-10-02 MED ORDER — ATENOLOL 25 MG PO TABS: 25.0000 mg | ORAL_TABLET | Freq: Every day | ORAL | 1 refills | Status: DC

## 2023-10-02 NOTE — Addendum Note (Signed)
Addended by: Ernest Mallick A on: 10/02/2023 08:43 AM   Modules accepted: Orders

## 2023-10-04 ENCOUNTER — Encounter: Payer: Self-pay | Admitting: Family Medicine

## 2023-10-05 ENCOUNTER — Other Ambulatory Visit: Payer: Self-pay

## 2023-10-05 MED ORDER — LEVOTHYROXINE SODIUM 112 MCG PO TABS: 112.0000 ug | ORAL_TABLET | Freq: Every day | ORAL | 3 refills | Status: DC

## 2023-10-05 NOTE — Telephone Encounter (Signed)
Copied from CRM [id number]. Topic: Clinical - Prescription Issue >> Oct 05, 2023 10:27 AM Geroge Baseman wrote: Reason for CRM: Pharmacy states that they need Dr. Wyline Mood approval over the phone to fill her pharmacy. Patient has been out since Friday and really needs her medication filled ASAP.

## 2023-10-07 ENCOUNTER — Other Ambulatory Visit: Payer: Self-pay | Admitting: Family Medicine

## 2023-10-07 NOTE — Telephone Encounter (Signed)
 Copied from CRM [id number]. Topic: Clinical - Medication Refill >> Oct 07, 2023  8:38 AM Antonio DEL wrote: Most Recent Primary Care Visit:  Provider: COLETTE TORRENCE GRADE  Department: [facility]  Visit Type: NEW PATIENT  Date: 08/19/2023  Medication: atenolol  (TENORMIN ) 25 MG tablet  Has the patient contacted their pharmacy? Yes (Agent: If no, request that the patient contact the pharmacy for the refill. If patient does not wish to contact the pharmacy document the reason why and proceed with request.) (Agent: If yes, when and what did the pharmacy advise?) No more refills at pharmacy  Is this the correct pharmacy for this prescription? Yes If no, delete pharmacy and type the correct one.  This is the patient's preferred pharmacy:  [facility] [address] [place], [place] - [address] [place] [address] Phone: [phone] Fax: [phone]   Has the prescription been filled recently? Yes  Is the patient out of the medication? No  Has the patient been seen for an appointment in the last year OR does the patient have an upcoming appointment? Yes  Can we respond through MyChart? Yes  Agent: Please be advised that Rx refills may take up to 3 business days. We ask that you follow-up with your pharmacy.

## 2024-04-28 ENCOUNTER — Telehealth: Payer: Self-pay | Admitting: Family Medicine

## 2024-04-28 NOTE — Telephone Encounter (Unsigned)
 Copied from CRM #[id number]. Topic: Clinical - Medication Refill >> Apr 28, 2024  3:13 PM Berwyn MATSU wrote: Medication:  levothyroxine  (SYNTHROID ) 112 MCG tablet and atenolol  (TENORMIN ) 25 MG tablet Has the patient contacted their pharmacy? Yes (Agent: If no, request that the patient contact the pharmacy for the refill. If patient does not wish to contact the pharmacy document the reason why and proceed with request.) (Agent: If yes, when and what did the pharmacy advise?)  This is the patient's preferred pharmacy:  [facility] [address] [place], [place] - [address] [place] [address] Phone: [phone] Fax: [phone]  Is this the correct pharmacy for this prescription? Yes If no, delete pharmacy and type the correct one.   Has the prescription been filled recently? Yes  Is the patient out of the medication? No  Has the patient been seen for an appointment in the last year OR does the patient have an upcoming appointment? Yes  Can we respond through MyChart? Yes  Agent: Please be advised that Rx refills may take up to 3 business days. We ask that you follow-up with your pharmacy.

## 2024-04-29 MED ORDER — ATENOLOL 25 MG PO TABS: 25.0000 mg | ORAL_TABLET | Freq: Every day | ORAL | 1 refills | Status: AC

## 2024-04-29 MED ORDER — LEVOTHYROXINE SODIUM 112 MCG PO TABS: 112.0000 ug | ORAL_TABLET | Freq: Every day | ORAL | 1 refills | Status: AC

## 2024-05-09 ENCOUNTER — Ambulatory Visit (INDEPENDENT_AMBULATORY_CARE_PROVIDER_SITE_OTHER): Payer: Self-pay | Admitting: Family Medicine

## 2024-05-09 VITALS — BP 110/70 | HR 69 | Temp 98.6°F | Resp 20 | Ht 65.0 in | Wt 148.0 lb

## 2024-05-09 DIAGNOSIS — Z23 Encounter for immunization: Secondary | ICD-10-CM | POA: Diagnosis not present

## 2024-05-09 DIAGNOSIS — I10 Essential (primary) hypertension: Secondary | ICD-10-CM | POA: Diagnosis not present

## 2024-05-09 DIAGNOSIS — E890 Postprocedural hypothyroidism: Secondary | ICD-10-CM | POA: Diagnosis not present

## 2024-05-09 NOTE — Progress Notes (Signed)
 Established Patient Office Visit  Subjective   Patient ID: Claire Haynes, female    DOB: 1980-12-25  Age: 43 y.o. MRN: [id number]  Chief Complaint  Patient presents with   Medical Management of Chronic Issues    Hypothyroidism Would like blood work Fastingish - only had coffee w/ a little bit of creamer   Medication Refill    Synthroid     Flu Vaccine    Would like documentation    Medication Refill   Hypothyroidism Pt taking Synthroid  112 mcg daily. Compliant with medication. No issues and not skipping doses.  HTN Taking Atenolol  25mg  daily for HTN. Stable and at goal today.  Would like flu vaccine today.  Review of Systems  All other systems reviewed and are negative.     Objective:     BP 110/70 (BP Location: Left Arm, Patient Position: Sitting, Cuff Size: Normal)   Pulse 69   Temp 98.6 F (37 C) (Oral)   Resp 20   Ht 5' 5 (1.651 m)   Wt 148 lb (67.1 kg)   LMP 04/27/2024   SpO2 97%   BMI 24.63 kg/m  BP Readings from Last 3 Encounters:  05/09/24 110/70  08/19/23 107/70  03/11/23 125/83      Physical Exam Vitals and nursing note reviewed.  Constitutional:      Appearance: Normal appearance. She is normal weight.  HENT:     Head: Normocephalic and atraumatic.     Right Ear: External ear normal.     Left Ear: External ear normal.     Nose: Nose normal.     Mouth/Throat:     Mouth: Mucous membranes are moist.     Pharynx: Oropharynx is clear.  Eyes:     Conjunctiva/sclera: Conjunctivae normal.     Pupils: Pupils are equal, round, and reactive to light.  Cardiovascular:     Rate and Rhythm: Normal rate and regular rhythm.  Pulmonary:     Effort: Pulmonary effort is normal.  Skin:    General: Skin is warm.     Capillary Refill: Capillary refill takes less than 2 seconds.  Neurological:     General: No focal deficit present.     Mental Status: She is alert and oriented to person, place, and time. Mental status is at baseline.  Psychiatric:         Mood and Affect: Mood normal.        Behavior: Behavior normal.        Thought Content: Thought content normal.        Judgment: Judgment normal.     No results found for any visits on 05/09/24.  Last thyroid  functions Lab Results  Component Value Date   TSH 2.500 08/19/2023      The ASCVD Risk score (Arnett DK, et al., 2019) failed to calculate for the following reasons:   Cannot find a previous HDL lab   Cannot find a previous total cholesterol lab    Assessment & Plan:   Problem List Items Addressed This Visit   None Postablative hypothyroidism -     TSH -     T4, free  Primary hypertension  Need for influenza vaccination -     Flu vaccine trivalent PF, 6mos and older(Flulaval,Afluria,Fluarix,Fluzone)  Pt with hypothyroidism. Chronic and stable. Recheck thyroid  studies today. HTN at goal. Continue Atenolol  25mg  daily. Flu vaccine today  See in 6 months sooner prn.   No follow-ups on file.    Torrence CINDERELLA Barrier,  MD

## 2024-05-10 LAB — T4, FREE: Free T4: 1.31 ng/dL (ref 0.82–1.77)

## 2024-05-10 LAB — TSH: TSH: 2.84 u[IU]/mL (ref 0.450–4.500)

## 2024-05-12 ENCOUNTER — Ambulatory Visit: Payer: Self-pay | Admitting: Family Medicine

## 2024-11-07 ENCOUNTER — Ambulatory Visit: Admitting: Family Medicine
